# Patient Record
Sex: Male | Born: 2000
Health system: Southern US, Community
[De-identification: ages and names within clinical notes are randomized; demographics above are authoritative.]

## PROBLEM LIST (undated history)

## (undated) DIAGNOSIS — L0100 Impetigo, unspecified: Secondary | ICD-10-CM

## (undated) DIAGNOSIS — F32A Depression, unspecified: Secondary | ICD-10-CM

## (undated) DIAGNOSIS — F419 Anxiety disorder, unspecified: Secondary | ICD-10-CM

## (undated) DIAGNOSIS — Z8489 Family history of other specified conditions: Secondary | ICD-10-CM

## (undated) DIAGNOSIS — B958 Unspecified staphylococcus as the cause of diseases classified elsewhere: Secondary | ICD-10-CM

## (undated) DIAGNOSIS — H900 Conductive hearing loss, bilateral: Secondary | ICD-10-CM

## (undated) DIAGNOSIS — T7840XA Allergy, unspecified, initial encounter: Secondary | ICD-10-CM

## (undated) DIAGNOSIS — J45909 Unspecified asthma, uncomplicated: Secondary | ICD-10-CM

## (undated) DIAGNOSIS — D169 Benign neoplasm of bone and articular cartilage, unspecified: Secondary | ICD-10-CM

## (undated) DIAGNOSIS — L089 Local infection of the skin and subcutaneous tissue, unspecified: Secondary | ICD-10-CM

## (undated) DIAGNOSIS — H539 Unspecified visual disturbance: Secondary | ICD-10-CM

## (undated) DIAGNOSIS — L0390 Cellulitis, unspecified: Secondary | ICD-10-CM

## (undated) HISTORY — PX: EAR TUBE REMOVAL: SHX1486

## (undated) HISTORY — DX: Depression, unspecified: F32.A

## (undated) HISTORY — PX: OSTEOCHONDROMA EXCISION: SHX2137

## (undated) HISTORY — PX: TYMPANOSTOMY TUBE PLACEMENT: SHX32

---
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
## 2020-01-17 MED FILL — DESCOVY 200-25 MG TABS: 200-25 | 30 days supply | Qty: 30 | Fill #1

## 2020-01-27 ENCOUNTER — Ambulatory Visit (INDEPENDENT_AMBULATORY_CARE_PROVIDER_SITE_OTHER): Payer: 59 | Admitting: Psychology

## 2020-01-27 DIAGNOSIS — F4323 Adjustment disorder with mixed anxiety and depressed mood: Secondary | ICD-10-CM

## 2020-02-13 MED FILL — DESCOVY 200-25 MG TABS: 200-25 | 30 days supply | Qty: 30 | Fill #2

## 2020-02-23 ENCOUNTER — Ambulatory Visit (INDEPENDENT_AMBULATORY_CARE_PROVIDER_SITE_OTHER): Payer: 59 | Admitting: Psychology

## 2020-02-23 DIAGNOSIS — F4323 Adjustment disorder with mixed anxiety and depressed mood: Secondary | ICD-10-CM | POA: Diagnosis not present

## 2020-03-07 ENCOUNTER — Other Ambulatory Visit: Payer: Self-pay | Admitting: Pharmacist

## 2020-03-07 DIAGNOSIS — Z79899 Other long term (current) drug therapy: Secondary | ICD-10-CM

## 2020-03-07 MED ORDER — DESCOVY 200-25 MG PO TABS: 1.0000 | ORAL_TABLET | Freq: Every day | ORAL | 2 refills | Status: DC

## 2020-03-14 MED FILL — DESCOVY 200-25 MG TABS: 200-25 | 30 days supply | Qty: 30 | Fill #0

## 2020-03-22 ENCOUNTER — Ambulatory Visit: Payer: 59 | Admitting: Psychology

## 2020-03-30 ENCOUNTER — Other Ambulatory Visit: Payer: Self-pay | Admitting: Physician Assistant

## 2020-03-30 ENCOUNTER — Encounter: Payer: Self-pay | Admitting: Physician Assistant

## 2020-03-30 DIAGNOSIS — Z79899 Other long term (current) drug therapy: Secondary | ICD-10-CM

## 2020-03-30 DIAGNOSIS — Z111 Encounter for screening for respiratory tuberculosis: Secondary | ICD-10-CM | POA: Diagnosis not present

## 2020-03-30 DIAGNOSIS — Z23 Encounter for immunization: Secondary | ICD-10-CM | POA: Diagnosis not present

## 2020-03-30 NOTE — Progress Notes (Signed)
TB ordered

## 2020-04-03 LAB — MEASLES/MUMPS/RUBELLA IMMUNITY
MUMPS ABS, IGG: <9 AU/mL — ABNORMAL LOW (ref >10.9)
RUBEOLA AB, IGG: 43.1 AU/mL (ref >16.4)
Rubella Antibodies, IGG: 1.09 index (ref >0.99)

## 2020-04-03 LAB — QUANTIFERON-TB GOLD PLUS
QuantiFERON Mitogen Value: >10 IU/mL
QuantiFERON Nil Value: 0.03 IU/mL
QuantiFERON TB1 Ag Value: 0.08 IU/mL
QuantiFERON TB2 Ag Value: 0.12 IU/mL
QuantiFERON-TB Gold Plus: NEGATIVE

## 2020-04-03 LAB — HEP, RPR, HIV PANEL
HIV Screen 4th Generation wRfx: NONREACTIVE
Hepatitis B Surface Ag: NEGATIVE
RPR Ser Ql: NONREACTIVE

## 2020-04-05 ENCOUNTER — Telehealth: Payer: Self-pay

## 2020-04-05 NOTE — Telephone Encounter (Signed)
Seen by patient Joshua Yang on 04/05/2020 11:15 AM

## 2020-04-05 NOTE — Telephone Encounter (Signed)
-----  Message from Mar Daring, [place] sent at 04/05/2020 11:15 AM EDT ----- STD screen is negative. MMR titer shows you are immune to measles and rubella but not to mumps. Not sure if college will require repeat MMR. If so we can arrange. TB screen is negative.

## 2020-04-16 MED FILL — DESCOVY 200-25 MG TABS: 200-25 | 30 days supply | Qty: 30 | Fill #1

## 2020-04-17 ENCOUNTER — Telehealth: Payer: Self-pay

## 2020-04-17 DIAGNOSIS — Z23 Encounter for immunization: Secondary | ICD-10-CM

## 2020-04-17 MED ORDER — MEASLES, MUMPS & RUBELLA VAC IJ SOLR: 0.5000 mL | Freq: Once | INTRAMUSCULAR | 0 refills | Status: AC

## 2020-04-17 NOTE — Telephone Encounter (Signed)
Joshua Yang can you send a prescription for an MMR for [facility] please to  [facility] [place]

## 2020-04-17 NOTE — Telephone Encounter (Signed)
Sent in

## 2020-04-19 ENCOUNTER — Ambulatory Visit (INDEPENDENT_AMBULATORY_CARE_PROVIDER_SITE_OTHER): Payer: 59 | Admitting: Psychology

## 2020-04-19 DIAGNOSIS — F323 Major depressive disorder, single episode, severe with psychotic features: Secondary | ICD-10-CM

## 2020-05-14 MED FILL — DESCOVY 200-25 MG TABS: 200-25 | 30 days supply | Qty: 30 | Fill #2

## 2020-05-25 ENCOUNTER — Encounter: Payer: Self-pay | Admitting: Physician Assistant

## 2020-05-25 ENCOUNTER — Telehealth (INDEPENDENT_AMBULATORY_CARE_PROVIDER_SITE_OTHER): Payer: 59 | Admitting: Physician Assistant

## 2020-05-25 DIAGNOSIS — F902 Attention-deficit hyperactivity disorder, combined type: Secondary | ICD-10-CM

## 2020-05-25 MED ORDER — METHYLPHENIDATE HCL ER (OSM) 27 MG PO TBCR: 27.0000 mg | EXTENDED_RELEASE_TABLET | ORAL | 0 refills | Status: DC

## 2020-05-25 NOTE — Patient Instructions (Signed)
Methylphenidate extended-release tablets What is this medicine? METHYLPHENIDATE (meth il FEN i date) is used to treat attention-deficit hyperactivity disorder (ADHD). It is also used to treat narcolepsy. This medicine may be used for other purposes; ask your health care provider or pharmacist if you have questions. COMMON BRAND NAME(S): Concerta, Metadate ER, Methylin, RELEXXII, Ritalin SR What should I tell my health care provider before I take this medicine? They need to know if you have any of these conditions:  anxiety or panic attacks  circulation problems in fingers and toes  difficulty swallowing, problems with the esophagus, or a history of blockage of the stomach or intestines  glaucoma  hardening or blockages of the arteries or heart blood vessels  heart disease or a heart defect  high blood pressure  history of a drug or alcohol abuse problem  history of stroke  liver disease  mental illness  motor tics, family history or diagnosis of Tourette's syndrome  seizures  suicidal thoughts, plans, or attempt; a previous suicide attempt by you or a family member  thyroid disease  an unusual or allergic reaction to methylphenidate, other medicines, foods, dyes, or preservatives  pregnant or trying to get pregnant  breast-feeding How should I use this medicine? Take this medicine by mouth with a glass of water. Follow the directions on the prescription label. Do not crush, cut, or chew the tablet. You may take this medicine with food. Take your medicine at regular intervals. Do not take it more often than directed. If you take your medicine more than once a day, try to take your last dose at least 8 hours before bedtime. This well help prevent the medicine from interfering with your sleep. A special MedGuide will be given to you by the pharmacist with each prescription and refill. Be sure to read this information carefully each time. Talk to your pediatrician regarding  the use of this medicine in children. While this drug may be prescribed for children as young as 6 years for selected conditions, precautions do apply. Overdosage: If you think you have taken too much of this medicine contact a poison control center or emergency room at once. NOTE: This medicine is only for you. Do not share this medicine with others. What if I miss a dose? If you miss a dose, take it as soon as you can. If it is almost time for your next dose, take only that dose. Do not take double or extra doses. What may interact with this medicine? Do not take this medicine with any of the following medications:  lithium  MAOIs like Carbex, Eldepryl, Marplan, Nardil, and Parnate  other stimulant medicines for attention disorders, weight loss, or to stay awake  procarbazine This medicine may also interact with the following medications:  atomoxetine  caffeine  certain medicines for blood pressure, heart disease, irregular heart beat  certain medicines for depression, anxiety, or psychotic disturbances  certain medicines for seizures like carbamazepine, phenobarbital, phenytoin  cold or allergy medicines  warfarin This list may not describe all possible interactions. Give your health care provider a list of all the medicines, herbs, non-prescription drugs, or dietary supplements you use. Also tell them if you smoke, drink alcohol, or use illegal drugs. Some items may interact with your medicine. What should I watch for while using this medicine? Visit your doctor or health care professional for regular checks on your progress. This prescription requires that you follow special procedures with your doctor and pharmacy. You will need to  have a new written prescription from your doctor or health care professional every time you need a refill. This medicine may affect your concentration, or hide signs of tiredness. Until you know how this drug affects you, do not drive, ride a  bicycle, use machinery, or do anything that needs mental alertness. Tell your doctor or health care professional if this medicine loses its effects, or if you feel you need to take more than the prescribed amount. Do not change the dosage without talking to your doctor or health care professional. For males, contact your doctor or health care professional right away if you have an erection that lasts longer than 4 hours or if it becomes painful. This may be a sign of a serious problem and must be treated right away to prevent permanent damage. Decreased appetite is a common side effect when starting this medicine. Eating small, frequent meals or snacks can help. Talk to your doctor if you continue to have poor eating habits. Height and weight growth of a child taking this medicine will be monitored closely. Do not take this medicine close to bedtime. It may prevent you from sleeping. The tablet shell for some brands of this medicine does not dissolve. This is normal. The tablet shell may appear whole in the stool. This is not a cause for concern. If you are going to need surgery, a MRI, CT scan, or other procedure, tell your doctor that you are taking this medicine. You may need to stop taking this medicine before the procedure. Tell your doctor or healthcare professional right away if you notice unexplained wounds on your fingers and toes while taking this medicine. You should also tell your healthcare provider if you experience numbness or pain, changes in the skin color, or sensitivity to temperature in your fingers or toes. What side effects may I notice from receiving this medicine? Side effects that you should report to your doctor or health care professional as soon as possible:  allergic reactions like skin rash, itching or hives, swelling of the face, lips, or tongue  changes in vision  chest pain or chest tightness  fast, irregular heartbeat  fingers or toes feel numb, cool,  painful  hallucination, loss of contact with reality  high blood pressure  males: prolonged or painful erection  seizures  severe headaches  severe stomach pain, vomiting  shortness of breath  suicidal thoughts or other mood changes  trouble swallowing  trouble walking, dizziness, loss of balance or coordination  uncontrollable head, mouth, neck, arm, or leg movements  unusual bleeding or bruising Side effects that usually do not require medical attention (report to your doctor or health care professional if they continue or are bothersome):  anxious  headache  loss of appetite  nausea  trouble sleeping  weight loss This list may not describe all possible side effects. Call your doctor for medical advice about side effects. You may report side effects to FDA at 1-800-FDA-1088. Where should I keep my medicine? Keep out of the reach of children. This medicine can be abused. Keep your medicine in a safe place to protect it from theft. Do not share this medicine with anyone. Selling or giving away this medicine is dangerous and against the law. This medicine may cause accidental overdose and death if taken by other adults, children, or pets. Mix any unused medicine with a substance like cat litter or coffee grounds. Then throw the medicine away in a sealed container like a sealed bag or  a coffee can with a lid. Do not use the medicine after the expiration date. Store at room temperature between 15 and 30 degrees C (59 and 86 degrees F). Protect from light and moisture. Keep container tightly closed. NOTE: This sheet is a summary. It may not cover all possible information. If you have questions about this medicine, talk to your doctor, pharmacist, or health care provider.  2020 Elsevier/Gold Standard (2016-12-09 15:01:37)

## 2020-05-25 NOTE — Progress Notes (Signed)
MyChart Video Visit    Virtual Visit via Video Note   This visit type was conducted due to national recommendations for restrictions regarding the COVID-19 Pandemic (e.g. social distancing) in an effort to limit this patient's exposure and mitigate transmission in our community. This patient is at least at moderate risk for complications without adequate follow up. This format is felt to be most appropriate for this patient at this time. Physical exam was limited by quality of the video and audio technology used for the visit.   Patient location: [facility] Provider location: [facility]  I discussed the limitations of evaluation and management by telemedicine and the availability of in person appointments. The patient expressed understanding and agreed to proceed.  Patient: Joshua Yang   DOB: 01/22/2001   18 y.o. Male  MRN: [id number] Visit Date: 05/25/2020  Today's healthcare provider: Mar Daring, PA-C   No chief complaint on file.  Subjective    HPI   Joshua Yang is an 19 yr old male that presents via mychart video visit for worsening ADHD. He was diagnosed when he was a kid at his pediatrician's office, but he was never medicated. He reports since he started college he has been having worsening symptoms and having issues with keeping grades up. He is interested in starting treatment. There is family history of ADHD in his mother.   Patient Active Problem List   Diagnosis Date Noted  . On pre-exposure prophylaxis for HIV 12/12/2019  . Vitamin D deficiency 09/07/2019  . Asthma   . 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 Osteochondroma 07/11/2015   Past Medical History:  Diagnosis Date  . Allergy    seasonal  . Anxiety     no medications  . Asthma    allergy induced asthma  . Cellulitis    hx of  . Family history of adverse reaction to anesthesia    mother had historu of running a high fever after surgery, stated it is not malignant hyperthermia   . Hearing loss, conductive, bilateral   . Impetigo    hx of  . Multiple hereditary osteochondromas   . Staph skin infection   . Vision abnormalities    wears glasses      Medications: Outpatient Medications Prior to Visit  Medication Sig  . albuterol (VENTOLIN HFA) 108 (90 Base) MCG/ACT inhaler Inhale 2 puffs into the lungs every 6 (six) hours as needed for wheezing or shortness of breath. For shortness of breath  . Beclomethasone Dipropionate 80 MCG/ACT AERS Place 2 sprays into the nose daily.  [name] emtricitabine-tenofovir AF (DESCOVY) 200-25 MG tablet Take 1 tablet by mouth daily.  [name] gentamicin cream (GARAMYCIN) 0.1 % Apply 1 application topically 2 (two) times daily.  [name] levocetirizine (XYZAL) 5 MG tablet Take 1 tablet (5 mg total) by mouth every evening.  . sertraline (ZOLOFT) 100 MG tablet Take 1 tablet (100 mg total) by mouth daily.  [name] triamcinolone cream (KENALOG) 0.1 % Apply 1 application topically 2 (two) times daily.  . Vitamin D, Cholecalciferol, 25 MCG (1000 UT) CAPS Takes once daily   No facility-administered medications prior to visit.    Review of Systems  Constitutional: Negative.   Respiratory: Negative.   Cardiovascular: Negative.   Psychiatric/Behavioral: Positive for decreased concentration.    Last CBC Lab Results  Component Value Date   WBC 7.2 09/06/2019   HGB 15.6 09/06/2019   HCT 48.0 09/06/2019   MCV 81 09/06/2019   MCH 26.2 (L) 09/06/2019  RDW 13.2 09/06/2019   PLT 278 65/10/5463   Last metabolic panel Lab Results  Component Value Date   GLUCOSE 96 09/06/2019   NA 139 09/06/2019   K 4.3 09/06/2019   CL 103 09/06/2019   CO2 21 09/06/2019   BUN 11 09/06/2019   CREATININE 0.83 09/06/2019   GFRNONAA 128 09/06/2019   GFRAA 149 09/06/2019   CALCIUM 9.6 09/06/2019   PROT 7.1 09/06/2019   ALBUMIN 4.6 09/06/2019   LABGLOB 2.5 09/06/2019   AGRATIO 1.8 09/06/2019   BILITOT 0.2 09/06/2019   ALKPHOS 69 09/06/2019   AST 20 09/06/2019   ALT 27  09/06/2019      Objective    There were no vitals taken for this visit. BP Readings from Last 3 Encounters:  12/12/19 126/85  09/29/19 (!) 143/88  09/01/19 (!) 153/81   Wt Readings from Last 3 Encounters:  12/12/19 244 lb (110.7 kg) (>99 %, Z= 2.35)*  09/29/19 245 lb (111.1 kg) (>99 %, Z= 2.38)*  09/01/19 244 lb (110.7 kg) (>99 %, Z= 2.37)*   * Growth percentiles are based on CDC (Boys, 2-20 Years) data.      Physical Exam Vitals reviewed.  Constitutional:      General: He is not in acute distress.    Appearance: Normal appearance. He is well-developed. He is not ill-appearing.  HENT:     Head: Normocephalic and atraumatic.  Eyes:     Conjunctiva/sclera: Conjunctivae normal.  Pulmonary:     Effort: Pulmonary effort is normal. No respiratory distress.  Musculoskeletal:     Cervical back: Normal range of motion and neck supple.  Neurological:     Mental Status: He is alert.  Psychiatric:        Mood and Affect: Mood normal.        Behavior: Behavior normal.        Thought Content: Thought content normal.        Judgment: Judgment normal.        Assessment & Plan     1. Attention deficit hyperactivity disorder (ADHD), combined type Will start Concerta as below. F/U in 4-6 weeks to see how tolerating medication and if adjustments needed.  - methylphenidate (CONCERTA) 27 MG PO CR tablet; Take 1 tablet (27 mg total) by mouth every morning.  Dispense: 30 tablet; Refill: 0   No follow-ups on file.     I discussed the assessment and treatment plan with the patient. The patient was provided an opportunity to ask questions and all were answered. The patient agreed with the plan and demonstrated an understanding of the instructions.   The patient was advised to call back or seek an in-person evaluation if the symptoms worsen or if the condition fails to improve as anticipated.  I provided 15 minutes of face-to-face time during this encounter via MyChart Video enabled  encounter.  Reynolds Bowl, PA-C, have reviewed all documentation for this visit. The documentation on 05/29/20 for the exam, diagnosis, procedures, and orders are all accurate and complete.  [place] [facility] [phone] (phone) [phone] (fax)  [place]

## 2020-05-31 ENCOUNTER — Ambulatory Visit (INDEPENDENT_AMBULATORY_CARE_PROVIDER_SITE_OTHER): Payer: 59 | Admitting: Psychology

## 2020-05-31 DIAGNOSIS — F4323 Adjustment disorder with mixed anxiety and depressed mood: Secondary | ICD-10-CM | POA: Diagnosis not present

## 2020-06-05 ENCOUNTER — Other Ambulatory Visit: Payer: Self-pay | Admitting: Pharmacist

## 2020-06-05 ENCOUNTER — Other Ambulatory Visit (HOSPITAL_COMMUNITY): Payer: Self-pay | Admitting: Pharmacist

## 2020-06-05 DIAGNOSIS — Z79899 Other long term (current) drug therapy: Secondary | ICD-10-CM

## 2020-06-05 MED ORDER — DESCOVY 200-25 MG PO TABS: 1.0000 | ORAL_TABLET | Freq: Every day | ORAL | 3 refills | Status: DC

## 2020-06-05 NOTE — Progress Notes (Signed)
Patient's PA sent in [place] refills for a year. HIV PrEP is only supposed to be prescribed for 3 months at a time (pending negative HIV test). Will resend Rx but it is recommended to at least test for HIV every 3 months.

## 2020-06-07 MED FILL — DESCOVY 200-25 MG TABS: 200-25 | 30 days supply | Qty: 30 | Fill #0

## 2020-06-20 ENCOUNTER — Telehealth (INDEPENDENT_AMBULATORY_CARE_PROVIDER_SITE_OTHER): Payer: 59 | Admitting: Physician Assistant

## 2020-06-20 ENCOUNTER — Other Ambulatory Visit: Payer: Self-pay | Admitting: Physician Assistant

## 2020-06-20 ENCOUNTER — Other Ambulatory Visit: Payer: Self-pay

## 2020-06-20 ENCOUNTER — Encounter: Payer: Self-pay | Admitting: Physician Assistant

## 2020-06-20 DIAGNOSIS — F900 Attention-deficit hyperactivity disorder, predominantly inattentive type: Secondary | ICD-10-CM

## 2020-06-20 MED ORDER — METHYLPHENIDATE HCL ER (OSM) 54 MG PO TBCR: 54.0000 mg | EXTENDED_RELEASE_TABLET | ORAL | 0 refills | Status: DC

## 2020-06-20 NOTE — Progress Notes (Signed)
MyChart Video Visit    Virtual Visit via Video Note   This visit type was conducted due to national recommendations for restrictions regarding the COVID-19 Pandemic (e.g. social distancing) in an effort to limit this patient's exposure and mitigate transmission in our community. This patient is at least at moderate risk for complications without adequate follow up. This format is felt to be most appropriate for this patient at this time. Physical exam was limited by quality of the video and audio technology used for the visit.   Patient location: [facility]  Provider location: [facility]  I discussed the limitations of evaluation and management by telemedicine and the availability of in person appointments. The patient expressed understanding and agreed to proceed.  Patient: Joshua Yang   DOB: [birth year]   19 y.o. Male  MRN: [id number] Visit Date: 06/20/2020  Today's healthcare provider: Mar Daring, PA-C   No chief complaint on file.  Subjective    HPI  Follow up for ADHD  The patient was last seen for this 4-6 weeks ago. Changes made at last visit include Will start Concerta..  He reports excellent compliance with treatment. He feels that condition is Unchanged. He is not having side effects.   -----------------------------------------------------------------------------------------   Patient Active Problem List   Diagnosis Date Noted  . On pre-exposure prophylaxis for HIV 12/12/2019  . Vitamin D deficiency 09/07/2019  . Asthma   . 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 Osteochondroma 07/11/2015   Past Medical History:  Diagnosis Date  . Allergy    seasonal  . Anxiety     no medications  . Asthma    allergy induced asthma  . Cellulitis    hx of  . Family history of adverse reaction to anesthesia    mother had historu of running a high fever after surgery, stated it is not malignant hyperthermia  . Hearing loss, conductive, bilateral    . Impetigo    hx of  . Multiple hereditary osteochondromas   . Staph skin infection   . Vision abnormalities    wears glasses     Patient Active Problem List   Diagnosis Date Noted  . On pre-exposure prophylaxis for HIV 12/12/2019  . Vitamin D deficiency 09/07/2019  . Asthma   . 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 Osteochondroma 07/11/2015   Past Medical History:  Diagnosis Date  . Allergy    seasonal  . Anxiety     no medications  . Asthma    allergy induced asthma  . Cellulitis    hx of  . Family history of adverse reaction to anesthesia    mother had historu of running a high fever after surgery, stated it is not malignant hyperthermia  . Hearing loss, conductive, bilateral   . Impetigo    hx of  . Multiple hereditary osteochondromas   . Staph skin infection   . Vision abnormalities    wears glasses      Medications: Outpatient Medications Prior to Visit  Medication Sig  . albuterol (VENTOLIN HFA) 108 (90 Base) MCG/ACT inhaler Inhale 2 puffs into the lungs every 6 (six) hours as needed for wheezing or shortness of breath. For shortness of breath  . Beclomethasone Dipropionate 80 MCG/ACT AERS Place 2 sprays into the nose daily.  [name] emtricitabine-tenofovir AF (DESCOVY) 200-25 MG tablet Take 1 tablet by mouth daily.  [name] levocetirizine (XYZAL) 5 MG tablet Take 1 tablet (5 mg total) by mouth every evening.  . methylphenidate (  CONCERTA) 27 MG PO CR tablet Take 1 tablet (27 mg total) by mouth every morning.  . sertraline (ZOLOFT) 100 MG tablet Take 1 tablet (100 mg total) by mouth daily.  [name] triamcinolone cream (KENALOG) 0.1 % Apply 1 application topically 2 (two) times daily.  . Vitamin D, Cholecalciferol, 25 MCG (1000 UT) CAPS Takes once daily  . gentamicin cream (GARAMYCIN) 0.1 % Apply 1 application topically 2 (two) times daily. (Patient not taking: Reported on 06/20/2020)   No facility-administered medications prior to visit.    Review of Systems  Last  CBC Lab Results  Component Value Date   WBC 7.2 09/06/2019   HGB 15.6 09/06/2019   HCT 48.0 09/06/2019   MCV 81 09/06/2019   MCH 26.2 (L) 09/06/2019   RDW 13.2 09/06/2019   PLT 278 70/26/3785   Last metabolic panel Lab Results  Component Value Date   GLUCOSE 96 09/06/2019   NA 139 09/06/2019   K 4.3 09/06/2019   CL 103 09/06/2019   CO2 21 09/06/2019   BUN 11 09/06/2019   CREATININE 0.83 09/06/2019   GFRNONAA 128 09/06/2019   GFRAA 149 09/06/2019   CALCIUM 9.6 09/06/2019   PROT 7.1 09/06/2019   ALBUMIN 4.6 09/06/2019   LABGLOB 2.5 09/06/2019   AGRATIO 1.8 09/06/2019   BILITOT 0.2 09/06/2019   ALKPHOS 69 09/06/2019   AST 20 09/06/2019   ALT 27 09/06/2019      Objective    There were no vitals taken for this visit. BP Readings from Last 3 Encounters:  12/12/19 126/85  09/29/19 (!) 143/88  09/01/19 (!) 153/81   Wt Readings from Last 3 Encounters:  12/12/19 244 lb (110.7 kg) (>99 %, Z= 2.35)*  09/29/19 245 lb (111.1 kg) (>99 %, Z= 2.38)*  09/01/19 244 lb (110.7 kg) (>99 %, Z= 2.37)*   * Growth percentiles are based on CDC (Boys, 2-20 Years) data.      Physical Exam     Assessment & Plan     1. Attention deficit hyperactivity disorder (ADHD), predominantly inattentive type Not to goal. Still having symptoms without any improvement. Increase Concerta to 54mg  as below. F/U in 4 weeks.  - methylphenidate (CONCERTA) 54 MG PO CR tablet; Take 1 tablet (54 mg total) by mouth every morning.  Dispense: 30 tablet; Refill: 0   No follow-ups on file.     I discussed the assessment and treatment plan with the patient. The patient was provided an opportunity to ask questions and all were answered. The patient agreed with the plan and demonstrated an understanding of the instructions.   The patient was advised to call back or seek an in-person evaluation if the symptoms worsen or if the condition fails to improve as anticipated.  I provided 11 minutes of face-to-face  time during this encounter via MyChart Video enabled encounter.  Reynolds Bowl, PA-C, have reviewed all documentation for this visit. The documentation on 06/20/20 for the exam, diagnosis, procedures, and orders are all accurate and complete.  [place] [facility] [phone] (phone) [phone] (fax)  [place]

## 2020-06-21 ENCOUNTER — Ambulatory Visit: Payer: 59

## 2020-06-28 ENCOUNTER — Ambulatory Visit (INDEPENDENT_AMBULATORY_CARE_PROVIDER_SITE_OTHER): Payer: 59 | Admitting: Psychology

## 2020-06-28 DIAGNOSIS — F4323 Adjustment disorder with mixed anxiety and depressed mood: Secondary | ICD-10-CM | POA: Diagnosis not present

## 2020-07-02 ENCOUNTER — Ambulatory Visit (INDEPENDENT_AMBULATORY_CARE_PROVIDER_SITE_OTHER): Payer: 59 | Admitting: Physician Assistant

## 2020-07-02 ENCOUNTER — Other Ambulatory Visit: Payer: Self-pay

## 2020-07-02 DIAGNOSIS — Z23 Encounter for immunization: Secondary | ICD-10-CM

## 2020-07-02 DIAGNOSIS — Z20822 Contact with and (suspected) exposure to covid-19: Secondary | ICD-10-CM | POA: Diagnosis not present

## 2020-07-02 NOTE — Progress Notes (Signed)
Nurse Visit: patient here for covid booster vaccine.Patient tolerated well.

## 2020-07-04 ENCOUNTER — Ambulatory Visit: Payer: 59 | Admitting: Physician Assistant

## 2020-07-09 MED FILL — DESCOVY 200-25 MG TABS: 200-25 | 30 days supply | Qty: 30 | Fill #1

## 2020-07-17 ENCOUNTER — Encounter: Payer: Self-pay | Admitting: Family Medicine

## 2020-07-17 ENCOUNTER — Other Ambulatory Visit: Payer: Self-pay

## 2020-07-17 ENCOUNTER — Telehealth (INDEPENDENT_AMBULATORY_CARE_PROVIDER_SITE_OTHER): Payer: 59 | Admitting: Family Medicine

## 2020-07-17 DIAGNOSIS — R42 Dizziness and giddiness: Secondary | ICD-10-CM

## 2020-07-17 DIAGNOSIS — R197 Diarrhea, unspecified: Secondary | ICD-10-CM

## 2020-07-17 MED ORDER — MECLIZINE HCL 12.5 MG PO TABS: 12.5000 mg | ORAL_TABLET | Freq: Three times a day (TID) | ORAL | 0 refills | Status: AC | PRN

## 2020-07-17 NOTE — Progress Notes (Signed)
MyChart Video Visit    Virtual Visit via Video Note   This visit type was conducted due to national recommendations for restrictions regarding the COVID-19 Pandemic (e.g. social distancing) in an effort to limit this patient's exposure and mitigate transmission in our community. This patient is at least at moderate risk for complications without adequate follow up. This format is felt to be most appropriate for this patient at this time. Physical exam was limited by quality of the video and audio technology used for the visit.   Patient location: HOME Provider location: OFFICE  I discussed the limitations of evaluation and management by telemedicine and the availability of in person appointments. The patient expressed understanding and agreed to proceed.  Patient: Joshua Yang   DOB: 23-May-2001   19 y.o. Male  MRN: [id number] Visit Date: 07/17/2020  Today's healthcare provider: Vernie Murders, PA   No chief complaint on file.  Subjective    HPI  Patient is a 19 year old male who presents via video visit for evaluation of dizziness.  He states it began yesterday when he was lying on his bed and went to get up to use the bathroom he first noticed being dizzy. While in the bathroom he states he developed nausea but was unable to vomit.  He thought sleep might help so, he went back to bed but when he woke again he was still having the dizziness.  He states it is not like the room spinning but described it as feeling like he is in bed and going to fall out of bed.  He took Advil last night and got a little relief but it did not last.   He states that he has not been taking his medications the way he should.  He had been off of his Concerta for 2 weeks and just got back on it this morning for the first time.  He also said he has not taken any of his Sertraline since las week.   He denies any cold like symptoms.  He has had a mild headache and some visual disturbance  Past Medical  History:  Diagnosis Date  . Allergy    seasonal  . Anxiety     no medications  . Asthma    allergy induced asthma  . Cellulitis    hx of  . Family history of adverse reaction to anesthesia    mother had historu of running a high fever after surgery, stated it is not malignant hyperthermia  . Hearing loss, conductive, bilateral   . Impetigo    hx of  . Multiple hereditary osteochondromas   . Staph skin infection   . Vision abnormalities    wears glasses   Past Surgical History:  Procedure Laterality Date  . EAR TUBE REMOVAL    . OSTEOCHONDROMA EXCISION     x2 one on heel and one on upper thigh  . OSTEOCHONDROMA EXCISION  03/10/2012   Procedure: OSTEOCHONDROMA EXCISION;  Surgeon: Newt Minion, MD;  Location: [facility];  Service: Orthopedics;  Laterality: Right;  Excision distal right tibia and fibula osteochondroma  . OSTEOCHONDROMA EXCISION Right 03/30/2013   Procedure: EXCISION OSTEOCHONDROMA RIGHT SHOULDER AND RIGHT WRIST;  Surgeon: Newt Minion, MD;  Location: [facility];  Service: Orthopedics;  Laterality: Right;  . OSTEOCHONDROMA EXCISION Bilateral 07/11/2015   Procedure: OSTEOCHONDROMA EXCISION BILATERAL MEDIAL TIBIAL PLATEAU;  Surgeon: Newt Minion, MD;  Location: [facility];  Service: Orthopedics;  Laterality: Bilateral;  .  OSTEOCHONDROMA EXCISION Left 04/01/2017   Procedure: OSTEOCHONDROMA EXCISION x2 LEFT WRIST;  Surgeon: Newt Minion, MD;  Location: [facility];  Service: Orthopedics;  Laterality: Left;  . TYMPANOSTOMY TUBE PLACEMENT     Social History   Tobacco Use  . Smoking status: Never Smoker  . Smokeless tobacco: Never Used  . Tobacco comment: No smokers in home  Substance Use Topics  . Alcohol use: No  . Drug use: No   Family History  Problem Relation Age of Onset  . Diabetes Other   . Hyperlipidemia Father   . Diabetes Maternal Grandfather   . Hyperlipidemia Maternal Grandfather   . Hypertension Maternal Grandfather   . Arthritis Maternal Grandfather   . Alcohol  abuse Paternal Grandmother   . Arthritis Paternal Grandmother   . Arthritis Mother   . Scoliosis Mother   . Asthma Mother   . Depression Mother   . Arthritis Maternal Grandmother   . Arthritis Paternal Grandfather   . Diabetes Other   . Stroke Sister   . Kidney disease Other   . Diabetes Other    Allergies  Allergen Reactions  . Latex Other (See Comments)    Skin irritation  . Amoxicillin-Pot Clavulanate Diarrhea and Nausea And Vomiting    Severe   . Tape Rash    Plastic Tape      Medications: Outpatient Medications Prior to Visit  Medication Sig  . methylphenidate (CONCERTA) 54 MG PO CR tablet Take 1 tablet (54 mg total) by mouth every morning.  [name] albuterol (VENTOLIN HFA) 108 (90 Base) MCG/ACT inhaler Inhale 2 puffs into the lungs every 6 (six) hours as needed for wheezing or shortness of breath. For shortness of breath (Patient not taking: Reported on 07/17/2020)  . Beclomethasone Dipropionate 80 MCG/ACT AERS Place 2 sprays into the nose daily. (Patient not taking: Reported on 07/17/2020)  . emtricitabine-tenofovir AF (DESCOVY) 200-25 MG tablet Take 1 tablet by mouth daily. (Patient not taking: Reported on 07/17/2020)  . gentamicin cream (GARAMYCIN) 0.1 % Apply 1 application topically 2 (two) times daily. (Patient not taking: Reported on 06/20/2020)  . levocetirizine (XYZAL) 5 MG tablet Take 1 tablet (5 mg total) by mouth every evening. (Patient not taking: Reported on 07/17/2020)  . sertraline (ZOLOFT) 100 MG tablet Take 1 tablet (100 mg total) by mouth daily. (Patient not taking: Reported on 07/17/2020)  . triamcinolone cream (KENALOG) 0.1 % Apply 1 application topically 2 (two) times daily. (Patient not taking: Reported on 07/17/2020)  . Vitamin D, Cholecalciferol, 25 MCG (1000 UT) CAPS Takes once daily (Patient not taking: Reported on 07/17/2020)   No facility-administered medications prior to visit.    Review of Systems  HENT: Negative for congestion, ear pain,  postnasal drip, rhinorrhea, sinus pressure, sinus pain, sore throat and tinnitus.   Gastrointestinal: Positive for diarrhea (1 EPISODE TODAY) and nausea. Negative for abdominal pain.  Musculoskeletal: Positive for myalgias (left arm pain).  Neurological: Positive for dizziness and headaches.      Objective    There were no vitals taken for this visit.   Physical Exam: WDWN male in no apparent distress.  Head: Normocephalic, atraumatic. Neck: Supple, NROM Respiratory: No apparent distress Psych: Normal mood and affect     Assessment & Plan     1. Dizziness Onset the past couple days with slight nausea. No vomiting, congestion, cough or earache, but slight headache relieved by Ibuprofen. Increase fluid intake and go on a bland diet. Treat with Meclizine to help control dizziness  and nausea. Recheck prn. - meclizine (ANTIVERT) 12.5 MG tablet; Take 1 tablet (12.5 mg total) by mouth 3 (three) times daily as needed for dizziness.  Dispense: 30 tablet; Refill: 0  2. Diarrhea, unspecified type A little loose stool today but no abdominal cramps or blood in stools. No vomiting. May use Pepto-Bismol prn and increase fluid intake with bland diet.   No follow-ups on file.     I discussed the assessment and treatment plan with the patient. The patient was provided an opportunity to ask questions and all were answered. The patient agreed with the plan and demonstrated an understanding of the instructions.   The patient was advised to call back or seek an in-person evaluation if the symptoms worsen or if the condition fails to improve as anticipated.  I provided 20 minutes of non-face-to-face time during this encounter.  Andres Shad, PA, have reviewed all documentation for this visit. The documentation on 07/17/20 for the exam, diagnosis, procedures, and orders are all accurate and complete.   Vernie Murders, [place] [phone] (phone) [phone]  (fax)  [place]

## 2020-07-26 ENCOUNTER — Ambulatory Visit (INDEPENDENT_AMBULATORY_CARE_PROVIDER_SITE_OTHER): Payer: 59 | Admitting: Psychology

## 2020-07-26 DIAGNOSIS — F4323 Adjustment disorder with mixed anxiety and depressed mood: Secondary | ICD-10-CM

## 2020-08-09 ENCOUNTER — Other Ambulatory Visit: Payer: Self-pay | Admitting: Physician Assistant

## 2020-08-09 DIAGNOSIS — F900 Attention-deficit hyperactivity disorder, predominantly inattentive type: Secondary | ICD-10-CM

## 2020-08-09 MED ORDER — METHYLPHENIDATE HCL ER (OSM) 54 MG PO TBCR: 54.0000 mg | EXTENDED_RELEASE_TABLET | ORAL | 0 refills | Status: DC

## 2020-08-14 MED FILL — DESCOVY 200-25 MG TABS: 200-25 | 30 days supply | Qty: 30 | Fill #2

## 2020-08-22 ENCOUNTER — Ambulatory Visit (INDEPENDENT_AMBULATORY_CARE_PROVIDER_SITE_OTHER): Payer: 59 | Admitting: Psychology

## 2020-08-22 DIAGNOSIS — F4323 Adjustment disorder with mixed anxiety and depressed mood: Secondary | ICD-10-CM

## 2020-08-23 ENCOUNTER — Ambulatory Visit: Payer: 59 | Admitting: Psychology

## 2020-09-14 ENCOUNTER — Telehealth: Payer: 59 | Admitting: Physician Assistant

## 2020-09-14 DIAGNOSIS — M25531 Pain in right wrist: Secondary | ICD-10-CM | POA: Diagnosis not present

## 2020-09-14 NOTE — Progress Notes (Signed)
MyChart Video Visit    Virtual Visit via Video Note   This visit type was conducted due to national recommendations for restrictions regarding the COVID-19 Pandemic (e.g. social distancing) in an effort to limit this patient's exposure and mitigate transmission in our community. This patient is at least at moderate risk for complications without adequate follow up. This format is felt to be most appropriate for this patient at this time. Physical exam was limited by quality of the video and audio technology used for the visit.   Patient location: [facility] Provider location: [facility]  I discussed the limitations of evaluation and management by telemedicine and the availability of in person appointments. The patient expressed understanding and agreed to proceed.  Patient: Joshua Yang   DOB: 2001-03-18   20 y.o. Male  MRN: [id number] Visit Date: 09/14/2020  Today's healthcare provider: Mar Daring, PA-C   Chief Complaint  Patient presents with  . Wrist Pain   Subjective    Wrist Pain  The pain is present in the right wrist. This is a new problem. The current episode started in the past 7 days. The problem has been gradually worsening. Pertinent negatives include no fever, inability to bear weight, itching, joint locking, joint swelling, limited range of motion, numbness, stiffness or tingling. He has tried NSAIDS for the symptoms. The treatment provided mild relief.    Patient does have a history of a bone tumor (osteochondroma) in the left wrist when he was younger. He reports this feels very similar. He reports that is main complaint is the he is noticing he is having some limited pronation and supination of the forearm and feels like something may be blocking. Pain is mild.   Patient Active Problem List   Diagnosis Date Noted  . On pre-exposure prophylaxis for HIV 12/12/2019  . Vitamin D deficiency 09/07/2019  . Asthma   . Multiple  hereditary exostoses 03/16/2017  . Osteochondroma 07/11/2015   Past Medical History:  Diagnosis Date  . Allergy    seasonal  . Anxiety     no medications  . Asthma    allergy induced asthma  . Cellulitis    hx of  . Family history of adverse reaction to anesthesia    mother had historu of running a high fever after surgery, stated it is not malignant hyperthermia  . Hearing loss, conductive, bilateral   . Impetigo    hx of  . Multiple hereditary osteochondromas   . Staph skin infection   . Vision abnormalities    wears glasses   Social History   Tobacco Use  . Smoking status: Never Smoker  . Smokeless tobacco: Never Used  . Tobacco comment: No smokers in home  Substance Use Topics  . Alcohol use: No  . Drug use: No   Allergies  Allergen Reactions  . Latex Other (See Comments)    Skin irritation  . Amoxicillin-Pot Clavulanate Diarrhea and Nausea And Vomiting    Severe   . Tape Rash    Plastic Tape    Medications: Outpatient Medications Prior to Visit  Medication Sig  . albuterol (VENTOLIN HFA) 108 (90 Base) MCG/ACT inhaler Inhale 2 puffs into the lungs every 6 (six) hours as needed for wheezing or shortness of breath. For shortness of breath  . Beclomethasone Dipropionate 80 MCG/ACT AERS Place 2 sprays into the nose daily.  [name] levocetirizine (XYZAL) 5 MG tablet Take 1 tablet (5 mg total) by mouth every  evening.  . meclizine (ANTIVERT) 12.5 MG tablet Take 1 tablet (12.5 mg total) by mouth 3 (three) times daily as needed for dizziness.  . methylphenidate (CONCERTA) 54 MG PO CR tablet Take 1 tablet (54 mg total) by mouth every morning.  . sertraline (ZOLOFT) 100 MG tablet Take 1 tablet (100 mg total) by mouth daily.   No facility-administered medications prior to visit.    Review of Systems  Constitutional: Negative.  Negative for fever.  Respiratory: Negative.   Cardiovascular: Negative.   Musculoskeletal: Positive for arthralgias. Negative for back pain, gait  problem, joint swelling, myalgias, neck pain, neck stiffness and stiffness.  Skin: Negative for itching.  Neurological: Negative for tingling and numbness.      Objective    There were no vitals taken for this visit.   Physical Exam   Patient denies any numbness or tingling. ROM is currently maintained. Negative Tinel and Phalen sign when self performed. Wrist flexion, extension, pronation and supination do not increase pain. Noted with some tenderness with ulnar and radial deviation. Patient denies any palpable mass.   Assessment & Plan     1. Right wrist pain Patient with h/o osteochondroma of the left. Now with similar symptoms on the right. Will try a cock-up wrist splint with activity/writing. Will get xray as below. I will f/u pending results.  - DG Wrist Complete Right; Future   No follow-ups on file.     I discussed the assessment and treatment plan with the patient. The patient was provided an opportunity to ask questions and all were answered. The patient agreed with the plan and demonstrated an understanding of the instructions.   The patient was advised to call back or seek an in-person evaluation if the symptoms worsen or if the condition fails to improve as anticipated.  I provided 13 minutes of face-to-face time during this encounter via MyChart Video enabled encounter.  Reynolds Bowl, PA-C, have reviewed all documentation for this visit. The documentation on 09/16/20 for the exam, diagnosis, procedures, and orders are all accurate and complete.  Rubye Beach [facility] [phone] (phone) [phone] (fax)  [place]

## 2020-09-16 ENCOUNTER — Encounter: Payer: Self-pay | Admitting: Physician Assistant

## 2020-09-19 ENCOUNTER — Ambulatory Visit: Payer: 59 | Admitting: Psychology

## 2020-09-20 ENCOUNTER — Ambulatory Visit: Payer: 59 | Admitting: Psychology

## 2020-09-21 ENCOUNTER — Ambulatory Visit: Payer: 59 | Admitting: Physician Assistant

## 2020-09-21 ENCOUNTER — Other Ambulatory Visit: Payer: Self-pay

## 2020-09-21 ENCOUNTER — Ambulatory Visit
Admission: RE | Admit: 2020-09-21 | Discharge: 2020-09-21 | Disposition: A | Discharge status: Home / Self Care | Payer: 59 | Attending: Physician Assistant | Admitting: Physician Assistant

## 2020-09-21 ENCOUNTER — Encounter: Payer: Self-pay | Admitting: Physician Assistant

## 2020-09-21 ENCOUNTER — Ambulatory Visit
Admission: RE | Admit: 2020-09-21 | Discharge: 2020-09-21 | Disposition: A | Discharge status: Home / Self Care | Payer: 59 | Source: Ambulatory Visit | Attending: Physician Assistant | Admitting: Physician Assistant

## 2020-09-21 VITALS — BP 131/89 | HR 75 | Temp 98.4°F | Wt 230.0 lb

## 2020-09-21 DIAGNOSIS — M25531 Pain in right wrist: Secondary | ICD-10-CM

## 2020-09-21 DIAGNOSIS — Z8349 Family history of other endocrine, nutritional and metabolic diseases: Secondary | ICD-10-CM | POA: Diagnosis not present

## 2020-09-21 DIAGNOSIS — Z113 Encounter for screening for infections with a predominantly sexual mode of transmission: Secondary | ICD-10-CM | POA: Diagnosis not present

## 2020-09-21 DIAGNOSIS — H6123 Impacted cerumen, bilateral: Secondary | ICD-10-CM | POA: Diagnosis not present

## 2020-09-21 DIAGNOSIS — E559 Vitamin D deficiency, unspecified: Secondary | ICD-10-CM | POA: Diagnosis not present

## 2020-09-21 NOTE — Progress Notes (Signed)
Established patient visit   Patient: Joshua Yang   DOB: 2001/07/14   20 y.o. Male  MRN: [id number] Visit Date: 09/21/2020  Today's healthcare provider: Mar Daring, PA-C   Chief Complaint  Patient presents with   Ear Fullness   Subjective    Ear Fullness  There is pain in both ears. This is a new problem. The current episode started 1 to 4 weeks ago. The problem occurs constantly. The problem has been unchanged. There has been no fever. The pain is mild. Pertinent negatives include no abdominal pain, coughing, diarrhea, ear discharge, headaches, hearing loss, neck pain, rash, rhinorrhea, sore throat or vomiting. He has tried NSAIDs and acetaminophen for the symptoms. The treatment provided mild relief.   Thinks he may have ear wax build up.    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   Past Medical History:  Diagnosis Date   Allergy    seasonal   Anxiety     no medications   Asthma    allergy induced asthma   Cellulitis    hx of   Family history of adverse reaction to anesthesia    mother had historu of running a high fever after surgery, stated it is not malignant hyperthermia   Hearing loss, conductive, bilateral    Impetigo    hx of   Multiple hereditary osteochondromas    Staph skin infection    Vision abnormalities    wears glasses   Social History   Tobacco Use   Smoking status: Never Smoker   Smokeless tobacco: Never Used   Tobacco comment: No smokers in home  Substance Use Topics   Alcohol use: No   Drug use: No   Allergies  Allergen Reactions   Latex Other (See Comments)    Skin irritation   Amoxicillin-Pot Clavulanate Diarrhea and Nausea And Vomiting    Severe    Tape Rash    Plastic Tape     Medications: Outpatient  Medications Prior to Visit  Medication Sig   albuterol (VENTOLIN HFA) 108 (90 Base) MCG/ACT inhaler Inhale 2 puffs into the lungs every 6 (six) hours as needed for wheezing or shortness of breath. For shortness of breath   Beclomethasone Dipropionate 80 MCG/ACT AERS Place 2 sprays into the nose daily.   levocetirizine (XYZAL) 5 MG tablet Take 1 tablet (5 mg total) by mouth every evening.   meclizine (ANTIVERT) 12.5 MG tablet Take 1 tablet (12.5 mg total) by mouth 3 (three) times daily as needed for dizziness.   methylphenidate (CONCERTA) 54 MG PO CR tablet Take 1 tablet (54 mg total) by mouth every morning.   sertraline (ZOLOFT) 100 MG tablet Take 1 tablet (100 mg total) by mouth daily.   No facility-administered medications prior to visit.    Review of Systems  Constitutional: Negative.  Negative for fatigue and fever.  HENT: Positive for ear pain. Negative for ear discharge, hearing loss, rhinorrhea and sore throat.   Respiratory: Negative for cough.   Gastrointestinal: Negative for abdominal pain, diarrhea and vomiting.  Musculoskeletal: Negative for neck pain.  Skin: Negative for rash.  Neurological: Negative for dizziness and headaches.        Objective    BP 131/89 (BP Location: Right Arm, Patient Position: Sitting, Cuff Size: Large)  Pulse 75    Temp 98.4 F (36.9 C) (Oral)    Wt 230 lb (104.3 kg)    BMI 33.00 kg/m    Physical Exam Vitals reviewed.  Constitutional:      General: He is not in acute distress.    Appearance: Normal appearance. He is well-developed and well-nourished. He is not ill-appearing.  HENT:     Head: Normocephalic and atraumatic.     Right Ear: Tympanic membrane, ear canal and external ear normal. There is impacted cerumen.     Left Ear: Tympanic membrane, ear canal and external ear normal. There is impacted cerumen.  Eyes:     Extraocular Movements: EOM normal.     Conjunctiva/sclera: Conjunctivae normal.  Pulmonary:     Effort:  Pulmonary effort is normal. No respiratory distress.  Musculoskeletal:     Cervical back: Normal range of motion and neck supple. No tenderness.  Lymphadenopathy:     Cervical: No cervical adenopathy.  Neurological:     Mental Status: He is alert.  Psychiatric:        Mood and Affect: Mood and affect normal.        Behavior: Behavior normal.        Thought Content: Thought content normal.        Judgment: Judgment normal.     No results found for any visits on 09/21/20.  Assessment & Plan     1. Screen for STD (sexually transmitted disease) Will check labs as below and f/u pending results. - HIV antibody (with reflex) - Hepatic function panel - RPR w/reflex to TrepSure  2. Vitamin D deficiency Will check labs as below and f/u pending results. - Vitamin D (25 hydroxy)  3. Family history of B12 deficiency Will check labs as below and f/u pending results. - B12  4. Bilateral impacted cerumen Lavage successful. TM viewed and clear. No signs of infection.  - Ear Lavage   No follow-ups on file.      Reynolds Bowl, PA-C, have reviewed all documentation for this visit. The documentation on 09/21/20 for the exam, diagnosis, procedures, and orders are all accurate and complete.   Rubye Beach  [facility] [phone] (phone) [phone] (fax)  [place]

## 2020-09-24 ENCOUNTER — Telehealth: Payer: Self-pay

## 2020-09-24 LAB — HEPATIC FUNCTION PANEL
ALT: 23 IU/L (ref 0–44)
AST: 22 IU/L (ref 0–40)
Albumin: 4.9 g/dL (ref 4.1–5.2)
Alkaline Phosphatase: 66 IU/L (ref 51–125)
Bilirubin Total: 0.6 mg/dL (ref 0.0–1.2)
Bilirubin, Direct: 0.15 mg/dL (ref 0.00–0.40)
Total Protein: 7 g/dL (ref 6.0–8.5)

## 2020-09-24 LAB — T PALLIDUM ANTIBODY, EIA: T pallidum Antibody, EIA: NEGATIVE

## 2020-09-24 LAB — VITAMIN D 25 HYDROXY (VIT D DEFICIENCY, FRACTURES): Vit D, 25-Hydroxy: 16 ng/mL — ABNORMAL LOW (ref 30.0–100.0)

## 2020-09-24 LAB — RPR W/REFLEX TO TREPSURE: RPR: NONREACTIVE

## 2020-09-24 LAB — HIV ANTIBODY (ROUTINE TESTING W REFLEX): HIV Screen 4th Generation wRfx: NONREACTIVE

## 2020-09-24 LAB — VITAMIN B12: Vitamin B-12: 363 pg/mL (ref 232–1245)

## 2020-09-24 NOTE — Telephone Encounter (Signed)
Imaging called to make sure you were aware of his results as MRI was recommended

## 2020-09-24 NOTE — Telephone Encounter (Signed)
Yes. I am awaiting patient response

## 2020-09-25 ENCOUNTER — Other Ambulatory Visit: Payer: Self-pay | Admitting: Physician Assistant

## 2020-09-25 DIAGNOSIS — F900 Attention-deficit hyperactivity disorder, predominantly inattentive type: Secondary | ICD-10-CM

## 2020-09-25 NOTE — Telephone Encounter (Signed)
Requested medication (s) are due for refill today: no  Requested medication (s) are on the active medication list: yes  Last refill:  08/09/2020  Future visit scheduled: no  Notes to clinic:  this refill cannot be delegated    Requested Prescriptions  Pending Prescriptions Disp Refills   CONCERTA 54 MG CR tablet [Pharmacy Med Name: CONCERTA 54 MG TABLET ER 54 Tablet] 30 tablet 0    Sig: TAKE 1 [place].      Not Delegated - Psychiatry:  Stimulants/ADHD Failed - 09/25/2020 12:36 PM      Failed - This refill cannot be delegated      Failed - Urine Drug Screen completed in last 360 days      Passed - Valid encounter within last 3 months    Recent Outpatient Visits           4 days ago Screen for STD (sexually transmitted disease)   [place], [place], [place]   1 week ago Right wrist pain   [place], [place]   2 months ago Dizziness   [place], PA-C   3 months ago Attention deficit hyperactivity disorder (ADHD), predominantly inattentive type   [place], [place]   4 months ago Attention deficit hyperactivity disorder (ADHD), combined type   [facility], [place], [place]

## 2020-09-26 ENCOUNTER — Other Ambulatory Visit: Payer: Self-pay | Admitting: Physician Assistant

## 2020-09-26 DIAGNOSIS — D1611 Benign neoplasm of short bones of right upper limb: Secondary | ICD-10-CM

## 2020-09-26 NOTE — Progress Notes (Signed)
Osteochondroma of right wrist; referral placed back to Dr. Sharol Given.

## 2020-10-12 DIAGNOSIS — H5213 Myopia, bilateral: Secondary | ICD-10-CM | POA: Diagnosis not present

## 2020-10-12 DIAGNOSIS — H52223 Regular astigmatism, bilateral: Secondary | ICD-10-CM | POA: Diagnosis not present

## 2020-10-12 DIAGNOSIS — H5359 Other color vision deficiencies: Secondary | ICD-10-CM | POA: Diagnosis not present

## 2020-10-18 ENCOUNTER — Ambulatory Visit (INDEPENDENT_AMBULATORY_CARE_PROVIDER_SITE_OTHER): Payer: 59 | Admitting: Psychology

## 2020-10-18 DIAGNOSIS — F4323 Adjustment disorder with mixed anxiety and depressed mood: Secondary | ICD-10-CM | POA: Diagnosis not present

## 2020-10-25 MED FILL — DESCOVY 200-25 MG TABS: 200-25 | 30 days supply | Qty: 30 | Fill #3

## 2020-11-15 ENCOUNTER — Other Ambulatory Visit (HOSPITAL_COMMUNITY): Payer: Self-pay

## 2020-11-19 ENCOUNTER — Other Ambulatory Visit (HOSPITAL_COMMUNITY): Payer: Self-pay

## 2020-11-21 ENCOUNTER — Other Ambulatory Visit (HOSPITAL_COMMUNITY): Payer: Self-pay

## 2020-11-22 ENCOUNTER — Ambulatory Visit (INDEPENDENT_AMBULATORY_CARE_PROVIDER_SITE_OTHER): Payer: 59 | Admitting: Psychology

## 2020-11-22 DIAGNOSIS — F4323 Adjustment disorder with mixed anxiety and depressed mood: Secondary | ICD-10-CM | POA: Diagnosis not present

## 2020-11-23 ENCOUNTER — Other Ambulatory Visit (HOSPITAL_COMMUNITY): Payer: Self-pay

## 2020-11-23 MED FILL — Emtricitabine-Tenofovir Alafenamide Fumarate Tab 200-25 MG: ORAL | 30 days supply | Qty: 30 | Fill #0 | Status: AC

## 2020-12-05 ENCOUNTER — Telehealth: Payer: Self-pay

## 2020-12-05 NOTE — Telephone Encounter (Signed)
I put pt on the schedule for a mychart visit at 340.  His mother states that due to not having insurance through the college that he could not go to the clinic on campus and due to his insurance through [employer] he needed to see someone in [facility] network.

## 2020-12-05 NOTE — Telephone Encounter (Signed)
Patient's mother Joshua Yang) is wanting to know if patient can be worked in today or tomorrow for a mychart visit for a possible ear infection.  He is away at college.  She wants to make sure you know that pt has a history of ear infections and that his eardrums have ruptured multiple times because of the ear infections.  Please call pt if able to be worked in.

## 2020-12-05 NOTE — Telephone Encounter (Signed)
Please advise 

## 2020-12-06 ENCOUNTER — Telehealth (INDEPENDENT_AMBULATORY_CARE_PROVIDER_SITE_OTHER): Payer: 59 | Admitting: Family Medicine

## 2020-12-06 DIAGNOSIS — H6691 Otitis media, unspecified, right ear: Secondary | ICD-10-CM | POA: Diagnosis not present

## 2020-12-06 MED ORDER — AMOXICILLIN 500 MG PO CAPS: 500.0000 mg | ORAL_CAPSULE | Freq: Three times a day (TID) | ORAL | 0 refills | Status: AC

## 2020-12-06 NOTE — Telephone Encounter (Signed)
Noted  

## 2020-12-06 NOTE — Progress Notes (Signed)
MyChart Video Visit    Virtual Visit via Video Note   This visit type was conducted due to national recommendations for restrictions regarding the COVID-19 Pandemic (e.g. social distancing) in an effort to limit this patient's exposure and mitigate transmission in our community. This patient is at least at moderate risk for complications without adequate follow up. This format is felt to be most appropriate for this patient at this time. Physical exam was limited by quality of the video and audio technology used for the visit.   Patient location: Home Provider location: Office  I discussed the limitations of evaluation and management by telemedicine and the availability of in person appointments. The patient expressed understanding and agreed to proceed.  Patient: Joshua Yang   DOB: 22-Feb-2001   20 y.o. Male  MRN: [id number] Visit Date: 12/06/2020  Today's healthcare provider: Wilhemena Durie, MD   No chief complaint on file.  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  He felt a pop and then felt some drainage from his right ear.  He has had ear infections before treated successfully with amoxicillin.  No known exposure to COVID and he has no cough or chest symptoms.  No fever headache or myalgias.     Medications: Outpatient Medications Prior to Visit  Medication Sig  . albuterol (VENTOLIN HFA) 108 (90 Base) MCG/ACT inhaler Inhale 2 puffs into the lungs every 6 (six) hours as needed for wheezing or shortness of breath. For shortness of breath  . Beclomethasone Dipropionate 80 MCG/ACT AERS Place 2 sprays into the nose daily.  . CONCERTA 54 MG CR tablet TAKE 1 TABLET BY MOUTH EVERY MORNING.  [name] emtricitabine-tenofovir AF (DESCOVY) 200-25 MG tablet TAKE 1 TABLET BY MOUTH DAILY.  [name] levocetirizine (XYZAL) 5 MG tablet Take 1 tablet (5 mg total) by mouth every evening.  . meclizine (ANTIVERT) 12.5 MG  tablet Take 1 tablet (12.5 mg total) by mouth 3 (three) times daily as needed for dizziness.  . methylphenidate 54 MG PO CR tablet TAKE 1 TABLET BY MOUTH EVERY MORNING.  . methylphenidate 54 MG PO CR tablet TAKE 1 TABLET BY MOUTH EVERY MORNING.  . methylphenidate 54 MG PO CR tablet TAKE 1 TABLET BY MOUTH EVERY MORNING  . sertraline (ZOLOFT) 100 MG tablet Take 1 tablet (100 mg total) by mouth daily.   No facility-administered medications prior to visit.    Review of Systems     Objective    There were no vitals taken for this visit.    Physical Exam  Patient is a pleasant cooperative and in no acute distress during the interview.   Assessment & Plan     1. Right otitis media, unspecified otitis media type Treat with Amoxil for 10 days and also will use Sudafed once or twice a day for a couple of days to help decongest.  He states Amoxil is helped him the most in the past.   No follow-ups on file.     I discussed the assessment and treatment plan with the patient. The patient was provided an opportunity to ask questions and all were answered. The patient agreed with the plan and demonstrated an understanding of the instructions.   The patient was advised to call back or seek an in-person evaluation if the symptoms worsen or if the condition fails to improve as anticipated.  I provided 10 minutes of  non-face-to-face time during this encounter.  I, Wilhemena Durie, MD, have reviewed all documentation for this visit. The documentation on 12/06/20 for the exam, diagnosis, procedures, and orders are all accurate and complete.   Richard Cranford Mon, MD [facility] [phone] (phone) [phone] (fax)  [place]

## 2020-12-17 ENCOUNTER — Other Ambulatory Visit (HOSPITAL_COMMUNITY): Payer: Self-pay

## 2020-12-19 ENCOUNTER — Other Ambulatory Visit (HOSPITAL_COMMUNITY): Payer: Self-pay

## 2020-12-20 ENCOUNTER — Ambulatory Visit (INDEPENDENT_AMBULATORY_CARE_PROVIDER_SITE_OTHER): Payer: 59 | Admitting: Psychology

## 2020-12-20 DIAGNOSIS — F4323 Adjustment disorder with mixed anxiety and depressed mood: Secondary | ICD-10-CM | POA: Diagnosis not present

## 2020-12-21 ENCOUNTER — Other Ambulatory Visit (HOSPITAL_COMMUNITY): Payer: Self-pay

## 2021-01-17 ENCOUNTER — Other Ambulatory Visit: Payer: Self-pay

## 2021-01-17 ENCOUNTER — Ambulatory Visit (INDEPENDENT_AMBULATORY_CARE_PROVIDER_SITE_OTHER): Payer: 59 | Admitting: Psychology

## 2021-01-17 ENCOUNTER — Other Ambulatory Visit: Payer: Self-pay | Admitting: Family Medicine

## 2021-01-17 DIAGNOSIS — F4323 Adjustment disorder with mixed anxiety and depressed mood: Secondary | ICD-10-CM | POA: Diagnosis not present

## 2021-01-17 NOTE — Telephone Encounter (Signed)
Requested medication (s) are due for refill today: yes  Requested medication (s) are on the active medication list: yes  Last refill:  09/25/20  Future visit scheduled: no  Notes to clinic:  not delegated    Requested Prescriptions  Pending Prescriptions Disp Refills   METHYLPHENIDATE 54 MG PO CR tablet [Pharmacy Med Name: methylphenidate (CONCERTA) 54 MG CR tablet] 30 tablet 0    Sig: TAKE 1 TABLET BY MOUTH EVERY MORNING.      Not Delegated - Psychiatry:  Stimulants/ADHD Failed - 01/17/2021  2:19 PM      Failed - This refill cannot be delegated      Failed - Urine Drug Screen completed in last 360 days      Passed - Valid encounter within last 3 months    Recent Outpatient Visits           1 month ago Right otitis media, unspecified otitis media type   [facility] Jerrol Banana., MD   3 months ago Screen for STD (sexually transmitted disease)   [place], [place]   4 months ago Right wrist pain   [place], [place]   6 months ago Dizziness   [place], PA-C   7 months ago Attention deficit hyperactivity disorder (ADHD), predominantly inattentive type   [facility], [place], [place]

## 2021-01-18 ENCOUNTER — Other Ambulatory Visit: Payer: Self-pay

## 2021-01-18 MED ORDER — LEVOCETIRIZINE DIHYDROCHLORIDE 5 MG PO TABS
ORAL_TABLET | ORAL | 3 refills | Status: DC
  Filled 2021-01-18: qty 90, 90d supply, fill #0
  Filled 2021-10-31: qty 90, 90d supply, fill #1

## 2021-01-18 MED FILL — Methylphenidate HCl Tab ER Osmotic Release (OSM) 54 MG: ORAL | 30 days supply | Qty: 30 | Fill #0 | Status: AC

## 2021-01-21 ENCOUNTER — Other Ambulatory Visit: Payer: Self-pay

## 2021-01-23 ENCOUNTER — Other Ambulatory Visit: Payer: Self-pay

## 2021-02-14 ENCOUNTER — Ambulatory Visit (INDEPENDENT_AMBULATORY_CARE_PROVIDER_SITE_OTHER): Payer: 59 | Admitting: Psychology

## 2021-02-14 DIAGNOSIS — F4323 Adjustment disorder with mixed anxiety and depressed mood: Secondary | ICD-10-CM | POA: Diagnosis not present

## 2021-03-21 ENCOUNTER — Ambulatory Visit: Payer: 59 | Admitting: Psychology

## 2021-03-27 ENCOUNTER — Ambulatory Visit (INDEPENDENT_AMBULATORY_CARE_PROVIDER_SITE_OTHER): Payer: 59 | Admitting: Psychology

## 2021-03-27 DIAGNOSIS — F4323 Adjustment disorder with mixed anxiety and depressed mood: Secondary | ICD-10-CM

## 2021-04-14 IMAGING — CR DG WRIST COMPLETE 3+V*R*
1 series · 4 of 4 positions shown · non-contrast
Comparison: 03/16/2017

CLINICAL DATA: Pain approximately 2 inches proximal to right wrist,
history of osteo chondroma

EXAM:
RIGHT WRIST - COMPLETE 3+ VIEW

[Series 1: dg wrist complete right · 0.14mm/px · 4 of 4 slices shown]
[im 1/4]
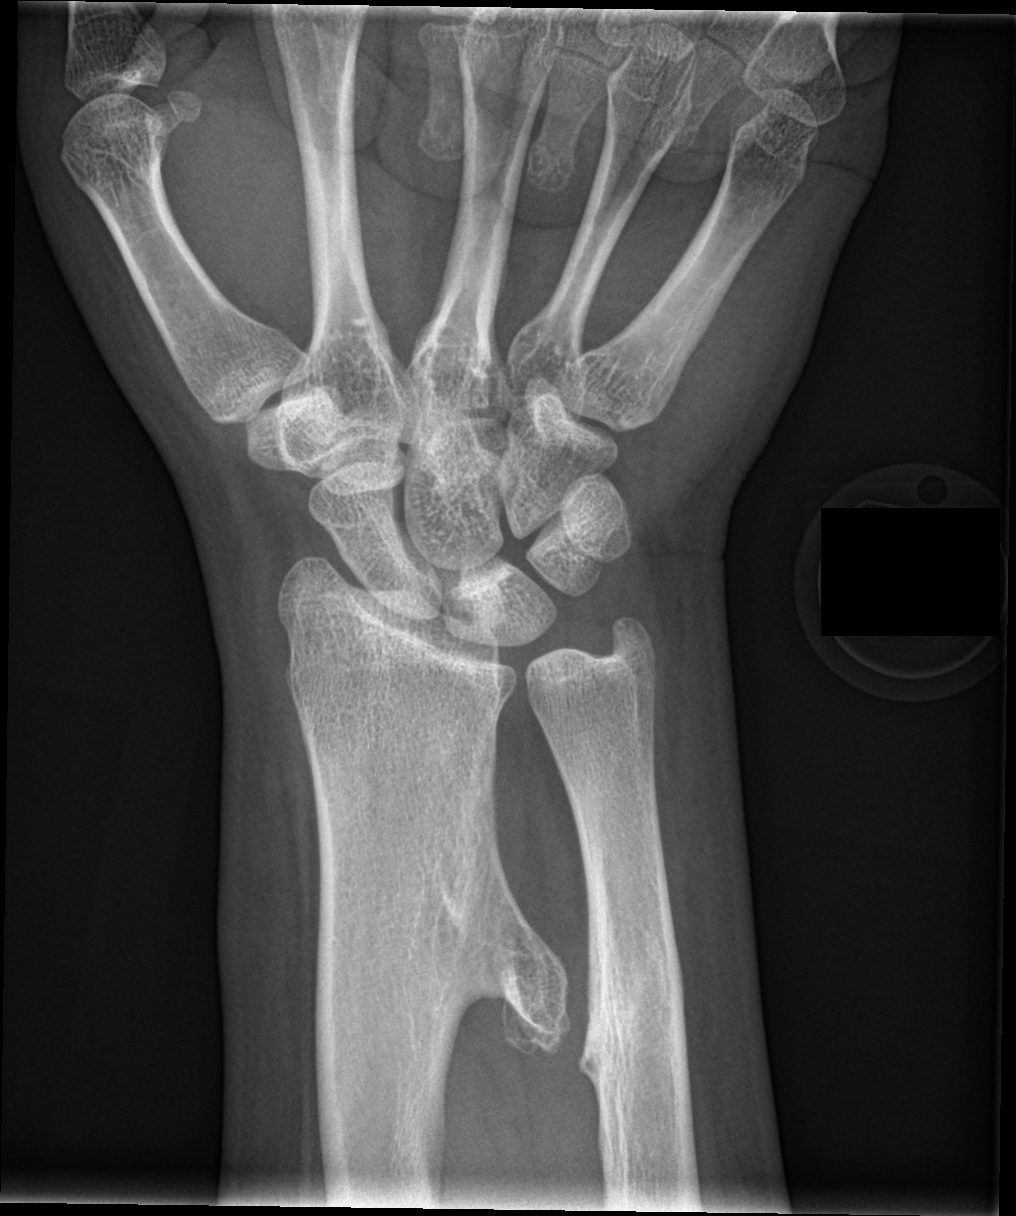
[im 2/4]
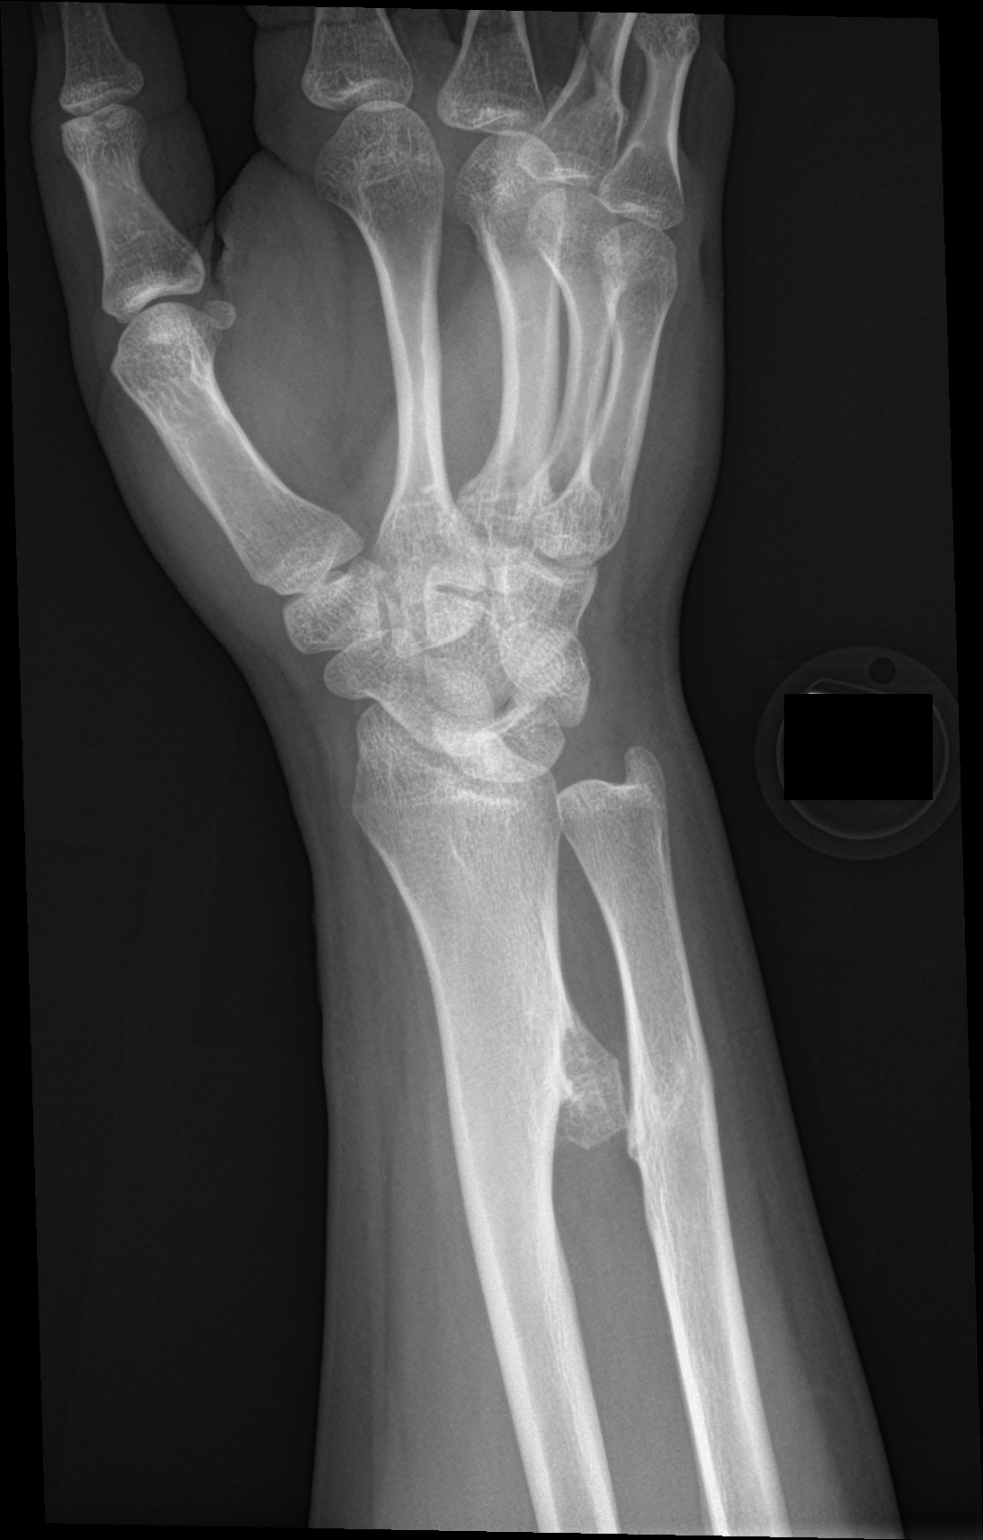
[im 3/4]
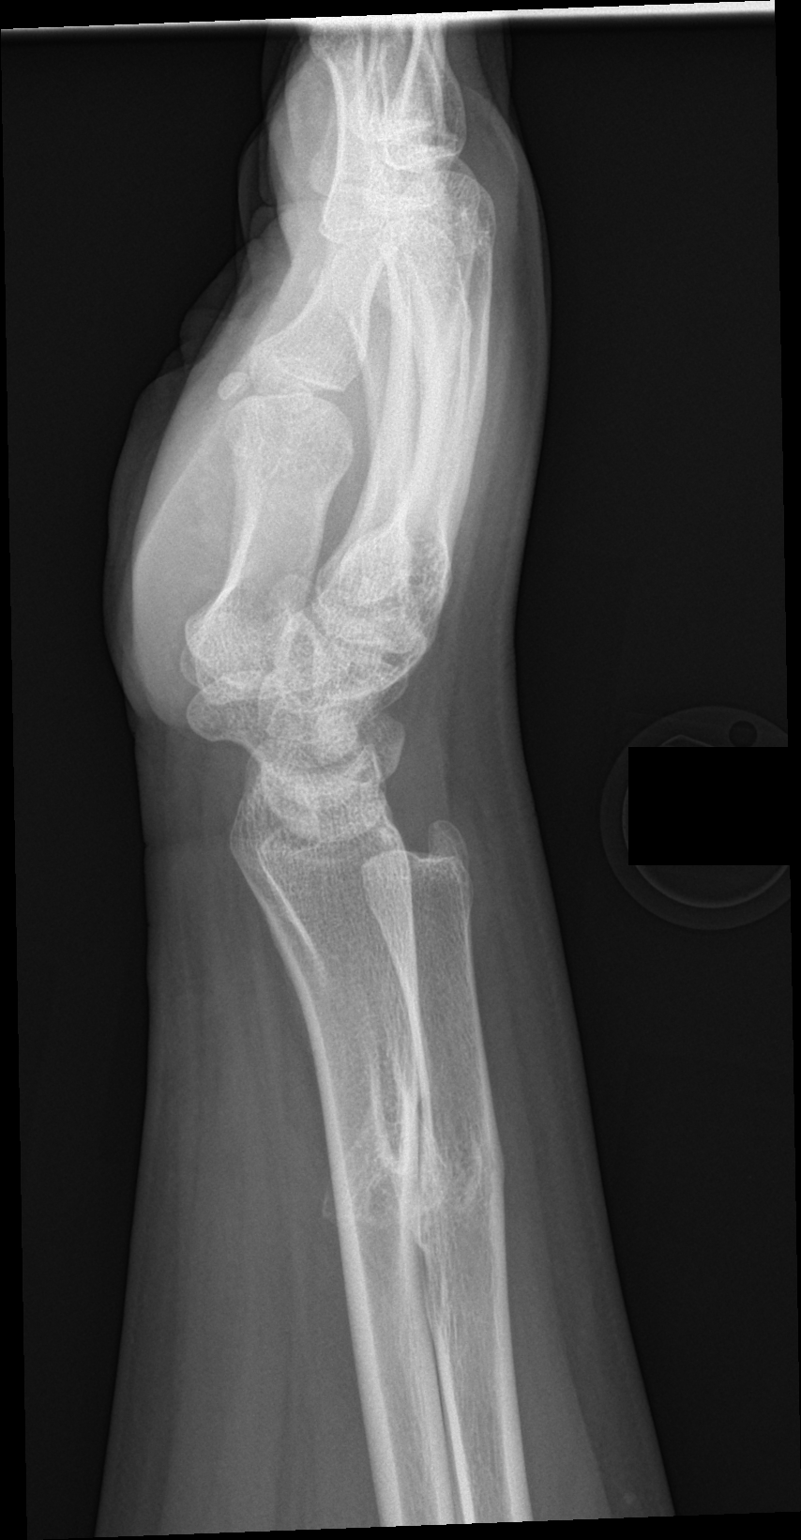
[im 4/4]
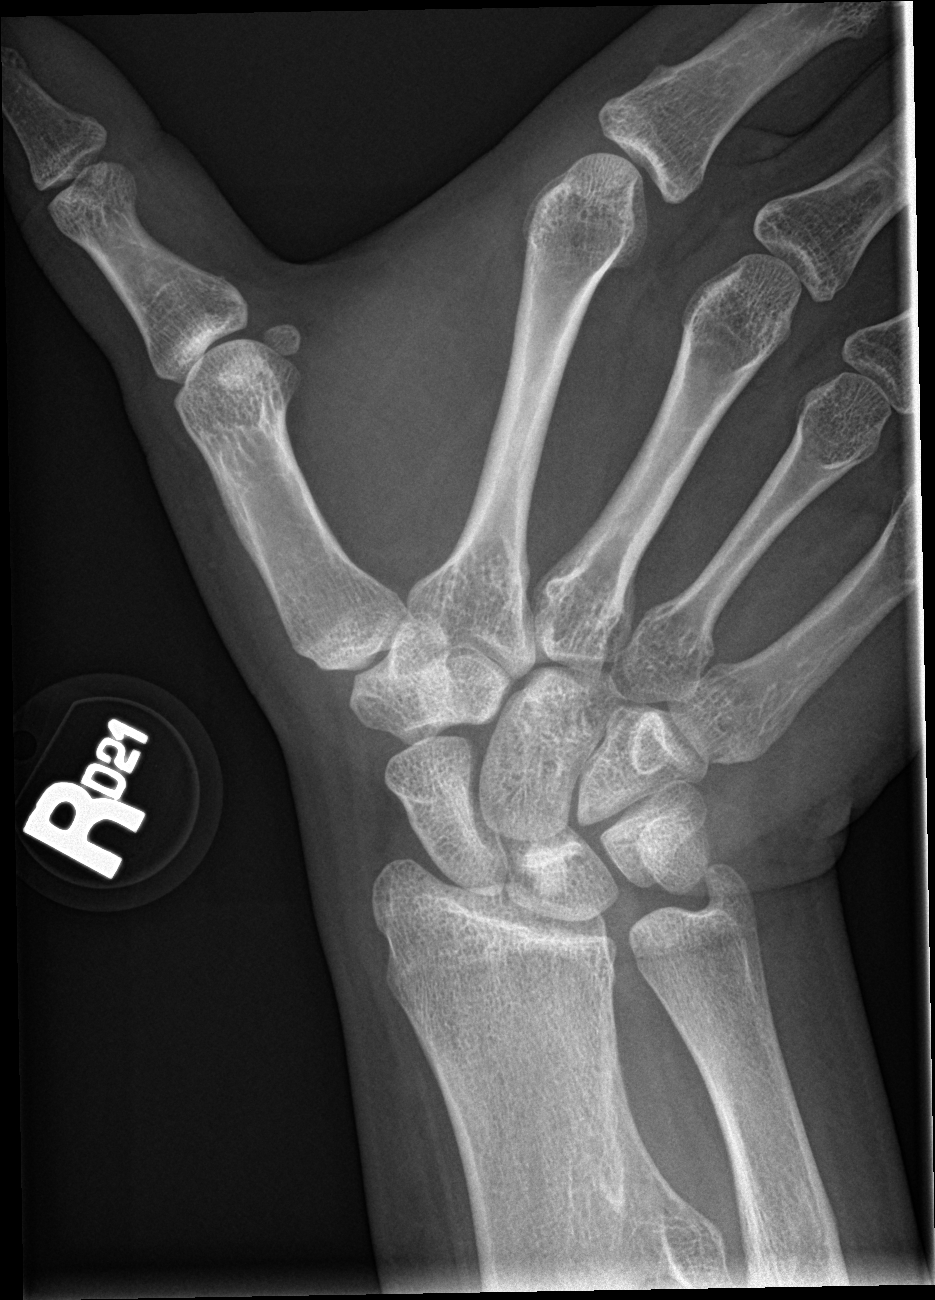

[4 of 4 positions shown; findings below may reference images not displayed]

FINDINGS: Frontal, oblique, lateral, and ulnar deviated views of the right
wrist are obtained. No fracture, subluxation, or dislocation. Joint
spaces are well preserved.

There is an exostosis off the ulnar aspect of the distal radial
metadiaphyseal junction, consistent with osteochondroma. Given the
complaint of pain, MRI may be useful for further evaluation.
IMPRESSION: 1. Osteochondroma of the distal right radius. Given pain at
location, MRI follow-up may be useful to evaluate for malignant
degeneration.
2. Otherwise unremarkable right wrist.

These results will be called to the ordering clinician or
representative by the Radiologist Assistant, and communication
documented in the PACS or [REDACTED].

## 2021-04-15 ENCOUNTER — Other Ambulatory Visit (HOSPITAL_COMMUNITY): Payer: Self-pay

## 2021-04-15 ENCOUNTER — Encounter: Payer: Self-pay | Admitting: Pharmacist

## 2021-04-19 ENCOUNTER — Other Ambulatory Visit (HOSPITAL_BASED_OUTPATIENT_CLINIC_OR_DEPARTMENT_OTHER): Payer: Self-pay

## 2021-04-23 ENCOUNTER — Other Ambulatory Visit (HOSPITAL_COMMUNITY): Payer: Self-pay

## 2021-04-25 ENCOUNTER — Ambulatory Visit: Payer: 59 | Admitting: Psychology

## 2021-04-30 ENCOUNTER — Ambulatory Visit (INDEPENDENT_AMBULATORY_CARE_PROVIDER_SITE_OTHER): Payer: 59 | Admitting: Psychology

## 2021-04-30 DIAGNOSIS — F4323 Adjustment disorder with mixed anxiety and depressed mood: Secondary | ICD-10-CM

## 2021-05-30 ENCOUNTER — Ambulatory Visit: Payer: 59 | Admitting: Psychology

## 2021-06-04 ENCOUNTER — Ambulatory Visit (INDEPENDENT_AMBULATORY_CARE_PROVIDER_SITE_OTHER): Payer: 59 | Admitting: Psychology

## 2021-06-04 DIAGNOSIS — F4323 Adjustment disorder with mixed anxiety and depressed mood: Secondary | ICD-10-CM

## 2021-06-27 ENCOUNTER — Ambulatory Visit (INDEPENDENT_AMBULATORY_CARE_PROVIDER_SITE_OTHER): Payer: 59 | Admitting: Psychology

## 2021-06-27 DIAGNOSIS — F4323 Adjustment disorder with mixed anxiety and depressed mood: Secondary | ICD-10-CM | POA: Diagnosis not present

## 2021-07-25 ENCOUNTER — Ambulatory Visit (INDEPENDENT_AMBULATORY_CARE_PROVIDER_SITE_OTHER): Payer: 59 | Admitting: Psychology

## 2021-07-25 DIAGNOSIS — F4323 Adjustment disorder with mixed anxiety and depressed mood: Secondary | ICD-10-CM

## 2021-07-25 NOTE — Progress Notes (Signed)
[place] Counselor/Therapist Progress Note  Patient ID: Joshua Yang, MRN: [id number],    Date: 07/25/2021  Time Spent: 60   Treatment Type: Individual Therapy  Reported Symptoms: anxiety  Mental Status Exam: Appearance:  Casual     Behavior: Appropriate  Motor: Normal  Speech/Language:  Normal Rate  Affect: Appropriate  Mood: normal  Thought process: normal  Thought content:   WNL  Sensory/Perceptual disturbances:   WNL  Orientation: oriented to person, place, and time/date  Attention: Good  Concentration: Good  Memory: WNL  Fund of knowledge:  Good  Insight:   Good  Judgment:  Good  Impulse Control: Good   Risk Assessment: Danger to Self:  No Self-injurious Behavior: No Danger to Others: No Duty to Warn:no Physical Aggression / Violence:No  Access to Firearms a concern: No  Gang Involvement:No   Subjective:   Pt present for face-to-face individual therapy via video Webex.   He consented to telehealth video session due to COVID-19 pandemic.    Location of pt: home Location of therapist: home office.   Pt talked about school.  He is done with classes for the semester and now has 4 final exams this next week.    Pt feels he will do fine on the exams and just wants the semester to be over.  Pt is thinking about taking a gap year after this year and eventually transferring to a college in [place] so he will be closer to friends.   Pt states he has some anxiety about going home for Christmas.  He feels it will be anxiety producing bc of the family dynamics.  Pt's brother is not talking to his sister.  Pt's sister is thinking about not coming home for Christmas.  Pt talked about the family drama and how it affects him.  He feels put in the middle at times.  Helped pt process his feelings and family dynamics.   Pt talked about his job.  He loves working for [employer] but the management at the [place] location is not good.  Addressed pt's  frustrations.   Provided supportive therapy.    Interventions: Cognitive Behavioral Therapy and Insight-Oriented  Diagnosis: F43.23  Plan: See pt's Treatment Plan for anxiety and depression in Therapy Charts.   (Treatment Plan Target Date:  11/22/2021) Plan to see pt in a month.  Zain Bingman, LCSW

## 2021-08-22 ENCOUNTER — Ambulatory Visit (INDEPENDENT_AMBULATORY_CARE_PROVIDER_SITE_OTHER): Payer: 59 | Admitting: Psychology

## 2021-08-22 DIAGNOSIS — F4323 Adjustment disorder with mixed anxiety and depressed mood: Secondary | ICD-10-CM

## 2021-08-22 NOTE — Progress Notes (Signed)
[place] Counselor/Therapist Progress Note  Patient ID: Joshua Yang, MRN: [id number],    Date: 08/22/2021  Time Spent: 2:00pm-3:00pm   60 minutes  Treatment Type: Individual Therapy  Reported Symptoms: anxiety, frustration  Mental Status Exam: Appearance:  Casual     Behavior: Appropriate  Motor: Normal  Speech/Language:  Normal Rate  Affect: Appropriate  Mood: normal  Thought process: normal  Thought content:   WNL  Sensory/Perceptual disturbances:   WNL  Orientation: oriented to person, place, and time/date  Attention: Good  Concentration: Good  Memory: WNL  Fund of knowledge:  Good  Insight:   Good  Judgment:  Good  Impulse Control: Good   Risk Assessment: Danger to Self:  No Self-injurious Behavior: No Danger to Others: No Duty to Warn:no Physical Aggression / Violence:No  Access to Firearms a concern: No  Gang Involvement:No   Subjective:   Pt present for face-to-face individual therapy via video Webex.   He consented to telehealth video session due to COVID-19 pandemic.    Location of pt: home Location of therapist: home office.   Pt talked about his Christmas vacation.  He said it was "interesting" but there was some family drama with his mother's family.   Addressed the issues and how they affected pt.   Helped him process family dynamics.   Pt talked about work.   There is a colleague [place] he tends to get frustrated with.   Pt talked about the issues with Mindy.  He states she is rigid and controlling.   Addressed how pt can relate and communicate with her effectively.  Pt talked about his frustrations with his manager and feels like his concerns were not adequately addressed.   Helped pt process his feelings and problem solve work dynamics.   Pt talked about college.  He plans to finish this semester at [employer] and then take a gap year to "reflect and take time for himself".   Pt wants to then return to school locally where he  has a better support system.  Pt is not sure how his parents will react and he is concerned about telling them so he plans to wait awhile.  Plan to help pt address this issue in future sessions.   Provided supportive therapy.    Interventions: Cognitive Behavioral Therapy and Insight-Oriented  Diagnosis: F43.23  Plan: See pt's Treatment Plan for anxiety and depression in Therapy Charts.   (Treatment Plan Target Date:  11/22/2021) Pt continues to progress toward treatment goals.  Plan to see pt in a month.   Alon Mazor, LCSW

## 2021-09-19 ENCOUNTER — Ambulatory Visit (INDEPENDENT_AMBULATORY_CARE_PROVIDER_SITE_OTHER): Payer: 59 | Admitting: Psychology

## 2021-09-19 DIAGNOSIS — F4323 Adjustment disorder with mixed anxiety and depressed mood: Secondary | ICD-10-CM | POA: Diagnosis not present

## 2021-09-19 NOTE — Progress Notes (Signed)
[place] Counselor/Therapist Progress Note  Patient ID: Joshua Yang, MRN: [id number],    Date: 09/19/2021  Time Spent: 2:00pm-3:00pm   60 minutes  Treatment Type: Individual Therapy  Reported Symptoms: anxiety  Mental Status Exam: Appearance:  Casual     Behavior: Appropriate  Motor: Normal  Speech/Language:  Normal Rate  Affect: Appropriate  Mood: normal  Thought process: normal  Thought content:   WNL  Sensory/Perceptual disturbances:   WNL  Orientation: oriented to person, place, and time/date  Attention: Good  Concentration: Good  Memory: WNL  Fund of knowledge:  Good  Insight:   Good  Judgment:  Good  Impulse Control: Good   Risk Assessment: Danger to Self:  No Self-injurious Behavior: No Danger to Others: No Duty to Warn:no Physical Aggression / Violence:No  Access to Firearms a concern: No  Gang Involvement:No   Subjective:   Pt present for face-to-face individual therapy via video Webex.   He consented to telehealth video session due to COVID-19 pandemic.    Location of pt: home Location of therapist: home office.   Pt talked about college.  All of his 3 classes are online so he decided to move out of the dorm and has moved back home.   He will be going back and forth between his parents home.  Pt's parents are supportive of him moving back home.   Pt talked with his mother about wanting to take a gap year from college.  He decided to wait to make a permanent decision until he sees how this semester goes with moving back home.   He was able to transfer work to the [employer] in [place].   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  Pt thinks that working at the [place] location will be better for him than [employer].   Pt was summoned for jury duty.  He will have to go March 9th.  Pt talked about having a lot of anxiety the other day.  He was in a grocery store and is not sure what triggered the anxiety.   It lasted for about an  hour.  He did not think to utilize calming strategies.  Worked with pt on calming strategies he can use.   Reminded him about how to use diaphramatic breathing.   Pt is worried about his Calculus class.  Math is very challenging for him.  He needs tutoring but does not like admitting that he needs help and asking for help.   Provided supportive therapy.    Interventions: Cognitive Behavioral Therapy and Insight-Oriented  Diagnosis: F43.23  Plan: See pt's Treatment Plan for anxiety and depression in Therapy Charts.   (Treatment Plan Target Date:  11/22/2021) Pt continues to progress toward treatment goals.  Plan to see pt in a month.   Allisson Schindel, LCSW

## 2021-10-24 ENCOUNTER — Ambulatory Visit: Payer: 59 | Admitting: Psychology

## 2021-10-28 ENCOUNTER — Ambulatory Visit (INDEPENDENT_AMBULATORY_CARE_PROVIDER_SITE_OTHER): Payer: 59 | Admitting: Psychology

## 2021-10-28 DIAGNOSIS — F4323 Adjustment disorder with mixed anxiety and depressed mood: Secondary | ICD-10-CM

## 2021-10-28 NOTE — Progress Notes (Signed)
[place] Counselor/Therapist Progress Note ? ?Patient ID: Joshua Yang, MRN: [id number],   ? ?Date: 10/28/2021 ? ?Time Spent: 11:00am-11:50am   50 minutes ? ?Treatment Type: Individual Therapy ? ?Reported Symptoms: anxiety ? ?Mental Status Exam: ?Appearance:  Casual     ?Behavior: Appropriate  ?Motor: Normal  ?Speech/Language:  Normal Rate  ?Affect: Appropriate  ?Mood: normal  ?Thought process: normal  ?Thought content:   WNL  ?Sensory/Perceptual disturbances:   WNL  ?Orientation: oriented to person, place, and time/date  ?Attention: Good  ?Concentration: Good  ?Memory: WNL  ?Fund of knowledge:  Good  ?Insight:   Good  ?Judgment:  Good  ?Impulse Control: Good  ? ?Risk Assessment: ?Danger to Self:  No ?Self-injurious Behavior: No ?Danger to Others: No ?Duty to Warn:no ?Physical Aggression / Violence:No  ?Access to Firearms a concern: No  ?Gang Involvement:No  ? ?Subjective:   ?Pt present for face-to-face individual therapy via video Webex.   He consented to telehealth video session due to COVID-19 pandemic.    ?Location of pt: home ?Location of therapist: home office.   ?Pt talked about being back home.  Pt's parents are concerned that he does well in school.   Pt states he is doing well in his classes.   Pt likes being home and being able to see his friends. ?Pt talked about work.  He was promoted to shift lead.  Pt is really happy with the promotion.   ?Pt has experienced some anxiety about his shift lead responsibilities.  He gets anxious that he will forget to do something when he is locking up.  Addressed strategies pt can put into place to help him remember what to do.    ?Worked with pt on calming strategies he can use.   Reminded him about how to use diaphramatic breathing.   ?Provided supportive therapy.   ? ?Interventions: Cognitive Behavioral Therapy and Insight-Oriented ? ?Diagnosis: F43.23 ? ?Plan: See pt's Treatment Plan for anxiety and depression in Therapy Charts.    (Treatment Plan Target Date:  11/22/2021) ?Pt continues to progress toward treatment goals.  ?Plan to see pt in a month.  ? ?Clint Bolder, LCSW ? ? ?

## 2021-10-31 ENCOUNTER — Other Ambulatory Visit: Payer: Self-pay | Admitting: Family Medicine

## 2021-10-31 ENCOUNTER — Other Ambulatory Visit (HOSPITAL_COMMUNITY): Payer: Self-pay

## 2021-11-01 ENCOUNTER — Other Ambulatory Visit (HOSPITAL_COMMUNITY): Payer: Self-pay

## 2021-11-01 NOTE — Telephone Encounter (Signed)
Requested medications are due for refill today.  Unsure ? ?Requested medications are on the active medications list.  yes ? ?Last refill. 09/25/2020 #30 0 refills ? ?Future visit scheduled.   no ? ?Notes to clinic.  Medication not delegated.  ? ? ? ?Requested Prescriptions  ?Pending Prescriptions Disp Refills  ? methylphenidate (CONCERTA) 54 MG PO CR tablet 30 tablet 0  ?  Sig: TAKE 1 TABLET BY MOUTH EVERY MORNING.  ?  ? Not Delegated - Psychiatry:  Stimulants/ADHD Failed - 10/31/2021  7:28 PM  ?  ?  Failed - This refill cannot be delegated  ?  ?  Failed - Urine Drug Screen completed in last 360 days  ?  ?  Failed - Valid encounter within last 6 months  ?  Recent Outpatient Visits   ? ?      ? 11 months ago Right otitis media, unspecified otitis media type  ? [facility] Jerrol Banana., MD  ? 1 year ago Screen for STD (sexually transmitted disease)  ? [place], [place]  ? 1 year ago Right wrist pain  ? [place], [place]  ? 1 year ago Dizziness  ? [place], PA-C  ? 1 year ago Attention deficit hyperactivity disorder (ADHD), predominantly inattentive type  ? [place], [place]  ? ?  ?  ?Future Appointments   ? ?        ? In 3 weeks [place], Blondell Reveal, MD [facility], [facility]  ? ?  ? ?  ?  ?  Passed - Last BP in normal range  ?  BP Readings from Last 1 Encounters:  ?09/21/20 131/89  ?  ?  ?  ?  Passed - Last Heart Rate in normal range  ?  Pulse Readings from Last 1 Encounters:  ?09/21/20 75  ?  ?  ?  ?  ?  ?

## 2021-11-05 ENCOUNTER — Other Ambulatory Visit (HOSPITAL_COMMUNITY): Payer: Self-pay

## 2021-11-06 ENCOUNTER — Other Ambulatory Visit (HOSPITAL_COMMUNITY): Payer: Self-pay

## 2021-11-15 ENCOUNTER — Other Ambulatory Visit (HOSPITAL_COMMUNITY): Payer: Self-pay

## 2021-11-15 ENCOUNTER — Ambulatory Visit (HOSPITAL_BASED_OUTPATIENT_CLINIC_OR_DEPARTMENT_OTHER): Payer: 59 | Admitting: Family Medicine

## 2021-11-15 VITALS — BP 128/72 | HR 86 | Temp 98.7°F | Ht 69.0 in | Wt 231.8 lb

## 2021-11-15 DIAGNOSIS — F32A Depression, unspecified: Secondary | ICD-10-CM | POA: Diagnosis not present

## 2021-11-15 DIAGNOSIS — Z7251 High risk heterosexual behavior: Secondary | ICD-10-CM | POA: Insufficient documentation

## 2021-11-15 DIAGNOSIS — F902 Attention-deficit hyperactivity disorder, combined type: Secondary | ICD-10-CM

## 2021-11-15 DIAGNOSIS — Z79899 Other long term (current) drug therapy: Secondary | ICD-10-CM

## 2021-11-15 MED ORDER — METHYLPHENIDATE HCL ER (OSM) 36 MG PO TBCR
36.0000 mg | EXTENDED_RELEASE_TABLET | Freq: Every day | ORAL | 0 refills | Status: DC
  Filled 2021-11-15: qty 30, 30d supply, fill #0

## 2021-11-15 MED ORDER — EMTRICITABINE-TENOFOVIR AF 200-25 MG PO TABS
1.0000 | ORAL_TABLET | Freq: Every day | ORAL | 2 refills | Status: DC
  Filled 2021-11-15 (×2): qty 30, 30d supply, fill #0

## 2021-11-15 MED ORDER — SERTRALINE HCL 50 MG PO TABS
50.0000 mg | ORAL_TABLET | Freq: Every day | ORAL | 1 refills | Status: DC
  Filled 2021-11-15: qty 30, 30d supply, fill #0

## 2021-11-15 NOTE — Patient Instructions (Signed)
?  Medication Instructions:  ?Your physician recommends that you continue on your current medications as directed. Please refer to the Current Medication list given to you today. ?--If you need a refill on any your medications before your next appointment, please call your pharmacy first. If no refills are authorized on file call the office.-- ? ?Lab Work: ?Your physician has recommended that you have lab work today: today will call back with results ?If you have labs (blood work) drawn today and your tests are completely normal, you will receive your results via [place] a phone call from our staff.  ?Please ensure you check your voicemail in the event that you authorized detailed messages to be left on a delegated number. If you have any lab test that is abnormal or we need to change your treatment, we will call you to review the results. ? ? ? ?Follow-Up: ?Your next appointment:   ?Your physician recommends that you schedule a follow-up appointment in: 2 week follow up virtual is okay with Dr. [place] ? ?You will receive a text message or e-mail with a link to a survey about your care and experience with [place] today! We would greatly appreciate your feedback!  ? ?Thanks for letting us be apart of your health journey!!  ?Primary Care and Sports Medicine  ? ?Dr. Kyung Rudd [place]  ? ?We encourage you to activate your patient portal called "MyChart".  Sign up information is provided on this After Visit Summary.  MyChart is used to connect with patients for Virtual Visits (Telemedicine).  Patients are able to view lab/test results, encounter notes, upcoming appointments, etc.  Non-urgent messages can be sent to your provider as well. To learn more about what you can do with MyChart, please visit --  [email or web address].    ?

## 2021-11-15 NOTE — Assessment & Plan Note (Signed)
Currently symptomatic with PHQ-9 score of 14.  Will resume sertraline, start with lower dose of 50 mg with plan to titrate over the next 2 to 4 weeks to 100 mg as long as tolerating the medication ?Plan for close follow-up in 2 weeks to monitor response to medication assess for any SI/HI.  Did discuss potential for worsening SI/HI with initiation of SSRI.  Discussed that if this does happen to contact the office or present to local emergency department, patient aware and in agreement ?

## 2021-11-15 NOTE — Assessment & Plan Note (Signed)
Continue with discovery, new prescription sent, will check labs today for monitoring ?

## 2021-11-15 NOTE — Assessment & Plan Note (Signed)
Check labs today for monitoring ?

## 2021-11-15 NOTE — Assessment & Plan Note (Signed)
Symptoms controlled in the past with Concerta, however at higher dose it seems that he was having some adverse effects ?We will resume Concerta but at lower dose of 36 mg, prescription sent to pharmacy on file ?Reviewed PDMP, no red flags today ?

## 2021-11-15 NOTE — Progress Notes (Signed)
? ?New Patient Office Visit ? ?Subjective:  ?Patient ID: Joshua Yang, male    DOB: 22-Mar-2001  Age: 21 y.o. MRN: [id number] ? ?CC: Establish care, anxiety/depression, ADHD, PrEP therapy ? ?HPI ?Joshua Yang is a 21 year old male presenting to establish in clinic.  He has current concerns as outlined above. ? ?ADHD: Was being managed by most recent PCP.  Most recently was on Concerta 54 mg.  Felt that it helped control symptoms, however he noticed that he had elevated heart rate with medication.  Has been without his medication for several months ? ?Anxiety/depression: Was also being managed by PCP, was utilizing sertraline 100 mg to control symptoms.  Reports good control of symptoms with this dose of medication.  Has also been without this medication for several months.  Does feel as though he has increased symptoms at present.  Continues to work with a Social worker ? ?PrEP therapy: Continues with use of Descovy, has been tolerating well, reports that he has not been out of this medication.  Was being managed by PCP.  Requesting refill today, knows that labs will need to be checked related to this ? ?Patient is originally from [place] area.  He is currently working at [employer] and is also in school virtually at [employer].  Thinks that he will be transferring to a school locally such as [employer].  After work he enjoys coloring, playing video games, swimming ? ?Past Medical History:  ?Diagnosis Date  ? Allergy   ? seasonal  ? Anxiety   ?  no medications  ? Asthma   ? allergy induced asthma  ? Cellulitis   ? hx of  ? Family history of adverse reaction to anesthesia   ? mother had historu of running a high fever after surgery, stated it is not malignant hyperthermia  ? Hearing loss, conductive, bilateral   ? Impetigo   ? hx of  ? Multiple hereditary osteochondromas   ? Staph skin infection   ? Vision abnormalities   ? wears glasses  ? ? ?Past Surgical History:  ?Procedure Laterality Date  ? EAR  TUBE REMOVAL    ? OSTEOCHONDROMA EXCISION    ? x2 one on heel and one on upper thigh  ? OSTEOCHONDROMA EXCISION  03/10/2012  ? Procedure: OSTEOCHONDROMA EXCISION;  Surgeon: Newt Minion, MD;  Location: [place];  Service: Orthopedics;  Laterality: Right;  Excision distal right tibia and fibula osteochondroma  ? OSTEOCHONDROMA EXCISION Right 03/30/2013  ? Procedure: EXCISION OSTEOCHONDROMA RIGHT SHOULDER AND RIGHT WRIST;  Surgeon: Newt Minion, MD;  Location: [place];  Service: Orthopedics;  Laterality: Right;  ? OSTEOCHONDROMA EXCISION Bilateral 07/11/2015  ? Procedure: OSTEOCHONDROMA EXCISION BILATERAL MEDIAL TIBIAL PLATEAU;  Surgeon: Newt Minion, MD;  Location: [place];  Service: Orthopedics;  Laterality: Bilateral;  ? OSTEOCHONDROMA EXCISION Left 04/01/2017  ? Procedure: OSTEOCHONDROMA EXCISION x2 LEFT WRIST;  Surgeon: Newt Minion, MD;  Location: [place];  Service: Orthopedics;  Laterality: Left;  ? TYMPANOSTOMY TUBE PLACEMENT    ? ? ?Family History  ?Problem Relation Age of Onset  ? Diabetes Other   ? Hyperlipidemia Father   ? Diabetes Maternal Grandfather   ? Hyperlipidemia Maternal Grandfather   ? Hypertension Maternal Grandfather   ? Arthritis Maternal Grandfather   ? Alcohol abuse Paternal Grandmother   ? Arthritis Paternal Grandmother   ? Arthritis Mother   ? Scoliosis Mother   ? Asthma Mother   ? Depression Mother   ?  Arthritis Maternal Grandmother   ? Arthritis Paternal Grandfather   ? Diabetes Other   ? Stroke Sister   ? Kidney disease Other   ? Diabetes Other   ? ? ?Social History  ? ?Socioeconomic History  ? Marital status: Single  ?  Spouse name: Not on file  ? Number of children: Not on file  ? Years of education: Not on file  ? Highest education level: Not on file  ?Occupational History  ? Not on file  ?Tobacco Use  ? Smoking status: Never  ? Smokeless tobacco: Never  ? Tobacco comments:  ?  No smokers in home  ?Substance and Sexual Activity  ? Alcohol use: No  ? Drug use: No  ? Sexual activity: Never   ?Other Topics Concern  ? Not on file  ?Social History Narrative  ? Not on file  ? ?Social Determinants of Health  ? ?Financial Resource Strain: Not on file  ?Food Insecurity: Not on file  ?Transportation Needs: Not on file  ?Physical Activity: Not on file  ?Stress: Not on file  ?Social Connections: Not on file  ?Intimate Partner Violence: Not on file  ? ? ?Objective:  ? ?Today's Vitals: BP 128/72   Pulse 86   Temp 98.7 ?F (37.1 ?C)   Ht '5\' 9"'$  (1.753 m)   Wt 231 lb 12.8 oz (105.1 kg)   SpO2 98%   BMI 34.23 kg/m?  ? ?Physical Exam ? ?21 year old male in no acute distress ?Cardiovascular exam with regular rate and rhythm ?Lungs clear to auscultation bilaterally ? ?Assessment & Plan:  ? ?Problem List Items Addressed This Visit   ? ?  ? Other  ? On pre-exposure prophylaxis for HIV  ?  Check labs today for monitoring ?  ?  ? Depression  ?  Currently symptomatic with PHQ-9 score of 14.  Will resume sertraline, start with lower dose of 50 mg with plan to titrate over the next 2 to 4 weeks to 100 mg as long as tolerating the medication ?Plan for close follow-up in 2 weeks to monitor response to medication assess for any SI/HI.  Did discuss potential for worsening SI/HI with initiation of SSRI.  Discussed that if this does happen to contact the office or present to local emergency department, patient aware and in agreement ?  ?  ? Relevant Medications  ? sertraline (ZOLOFT) 50 MG tablet  ? Attention deficit hyperactivity disorder (ADHD), combined type - Primary  ?  Symptoms controlled in the past with Concerta, however at higher dose it seems that he was having some adverse effects ?We will resume Concerta but at lower dose of 36 mg, prescription sent to pharmacy on file ?Reviewed PDMP, no red flags today ?  ?  ? Relevant Medications  ? methylphenidate (CONCERTA) 36 MG PO CR tablet  ? High risk sexual behavior  ?  Continue with discovery, new prescription sent, will check labs today for monitoring ?  ?  ? Relevant  Medications  ? emtricitabine-tenofovir AF (DESCOVY) 200-25 MG tablet  ? Other Relevant Orders  ? HIV antibody (with reflex)  ? Hepatitis C Antibody  ? Hepatitis B surface antigen  ? Hepatitis B Surface AntiBODY  ? ?Other Visit Diagnoses   ? ? High risk medication use      ? Relevant Orders  ? Basic Metabolic Panel (BMET)  ? ?  ? ? ?Outpatient Encounter Medications as of 11/15/2021  ?Medication Sig  ? albuterol (VENTOLIN HFA) 108 (90 Base) MCG/ACT  inhaler Inhale 2 puffs into the lungs every 6 (six) hours as needed for wheezing or shortness of breath. For shortness of breath  ? Beclomethasone Dipropionate 80 MCG/ACT AERS Place 2 sprays into the nose daily.  ? emtricitabine-tenofovir AF (DESCOVY) 200-25 MG tablet Take 1 tablet by mouth daily.  ? levocetirizine (XYZAL) 5 MG tablet Take 1 tablet (5 mg total) by mouth every evening.  ? meclizine (ANTIVERT) 12.5 MG tablet Take 1 tablet (12.5 mg total) by mouth 3 (three) times daily as needed for dizziness.  ? methylphenidate (CONCERTA) 36 MG PO CR tablet Take 1 tablet (36 mg total) by mouth daily.  ? sertraline (ZOLOFT) 50 MG tablet Take 1 tablet (50 mg total) by mouth daily.  ? [DISCONTINUED] levocetirizine (XYZAL) 5 MG tablet take one tablet by mouth daily every evening  ? [DISCONTINUED] CONCERTA 54 MG CR tablet TAKE 1 TABLET BY MOUTH EVERY MORNING. (Patient not taking: Reported on 11/15/2021)  ? [DISCONTINUED] methylphenidate 54 MG PO CR tablet TAKE 1 TABLET BY MOUTH EVERY MORNING.  ? [DISCONTINUED] methylphenidate 54 MG PO CR tablet TAKE 1 TABLET BY MOUTH EVERY MORNING.  ? [DISCONTINUED] methylphenidate 54 MG PO CR tablet TAKE 1 TABLET BY MOUTH EVERY MORNING  ? [DISCONTINUED] methylphenidate 54 MG PO CR tablet TAKE 1 TABLET BY MOUTH EVERY MORNING. (Patient not taking: Reported on 11/15/2021)  ? [DISCONTINUED] sertraline (ZOLOFT) 100 MG tablet Take 1 tablet (100 mg total) by mouth daily. (Patient not taking: Reported on 11/15/2021)  ? ?No facility-administered encounter  medications on file as of 11/15/2021.  ? ? ?Follow-up: Return in about 2 weeks (around 11/29/2021).  Plan for follow-up in 2 weeks to monitor therapy with sertraline, can be virtual ? ?Leopold Smyers J [place], MD ? ?

## 2021-11-16 LAB — HIV ANTIBODY (ROUTINE TESTING W REFLEX): HIV Screen 4th Generation wRfx: NONREACTIVE

## 2021-11-16 LAB — BASIC METABOLIC PANEL
BUN/Creatinine Ratio: 12 (ref 9–20)
BUN: 10 mg/dL (ref 6–20)
CO2: 20 mmol/L (ref 20–29)
Calcium: 9.7 mg/dL (ref 8.7–10.2)
Chloride: 106 mmol/L (ref 96–106)
Creatinine, Ser: 0.85 mg/dL (ref 0.76–1.27)
Glucose: 99 mg/dL (ref 70–99)
Potassium: 5.1 mmol/L (ref 3.5–5.2)
Sodium: 141 mmol/L (ref 134–144)
eGFR: 128 mL/min/{1.73_m2} (ref >59)

## 2021-11-16 LAB — HEPATITIS B SURFACE ANTIGEN: Hepatitis B Surface Ag: NEGATIVE

## 2021-11-16 LAB — HEPATITIS B SURFACE ANTIBODY,QUALITATIVE: Hep B Surface Ab, Qual: NONREACTIVE

## 2021-11-16 LAB — HEPATITIS C ANTIBODY: Hep C Virus Ab: NONREACTIVE

## 2021-11-25 ENCOUNTER — Ambulatory Visit (INDEPENDENT_AMBULATORY_CARE_PROVIDER_SITE_OTHER): Payer: 59 | Admitting: Psychology

## 2021-11-25 DIAGNOSIS — F4323 Adjustment disorder with mixed anxiety and depressed mood: Secondary | ICD-10-CM | POA: Diagnosis not present

## 2021-11-25 NOTE — Progress Notes (Signed)
[facility] Counselor Initial Adult Exam ? ?Name: Joshua Yang ?Date: 11/25/2021 ?MRN: [id number] ?DOB: May 30, 2001 ?PCP: de [place], [name], MD ? ?Time spent: 4:00pm - 4:55pm    55 minutes ? ?Guardian/Payee:  n/a   ? ?Paperwork requested: No  ? ?Reason for Visit /Presenting Problem:  ?Pt present for face-to-face initial assessment update via video Webex.  Pt consents to telehealth video session due to COVID 19 pandemic. ?Location of pt: home ?Location of therapist: home office.  ?Pt continues to need support as he navigates his current life stage of being in college.   Pt tends to worry about things that are not in his control.  ?Reviewed pt's treatment plan for annual update.  Pt participated in setting treatment goals.  See Treatment plan below.   ?Plan to continue to meet monthly.   ? ?Mental Status Exam: ?Appearance:   Casual     ?Behavior:  Appropriate  ?Motor:  Normal  ?Speech/Language:   Normal Rate  ?Affect:  Appropriate  ?Mood:  normal  ?Thought process:  normal  ?Thought content:    WNL  ?Sensory/Perceptual disturbances:    WNL  ?Orientation:  oriented to person, place, time/date, and situation  ?Attention:  Good  ?Concentration:  Good  ?Memory:  WNL  ?Fund of knowledge:   Good  ?Insight:    Good  ?Judgment:   Good  ?Impulse Control:  Good  ? ?Reported Symptoms:  stress ? ?Risk Assessment: ?Danger to Self:  No ?Self-injurious Behavior: No ?Danger to Others: No ?Duty to Warn:no ?Physical Aggression / Violence:No  ?Access to Firearms a concern: No  ?Gang Involvement:No  ?Patient / guardian was educated about steps to take if suicide or homicide risk level increases between visits: n/a ?While future psychiatric events cannot be accurately predicted, the patient does not currently require acute inpatient psychiatric care and does not currently meet [facility] involuntary commitment criteria. ? ?Substance Abuse History: ?Current substance abuse: No    ? ?Past Psychiatric History:    ?Previous psychological history is significant for anxiety and depression ?Outpatient Providers:pt has been in therapy for several years. ?History of Psych Hospitalization: No  ?Psychological Testing:  n/a   ? ?Abuse History:  ?Victim of: No.,  n/a    ?Report needed: No. ?Victim of Neglect:No. ?Perpetrator of  n/a   ?Witness / Exposure to Domestic Violence: No   ?Protective Services Involvement: No  ?Witness to [employer] Violence:  No  ? ?Family History:  ?Family History  ?Problem Relation Age of Onset  ? Diabetes Other   ? Hyperlipidemia Father   ? Diabetes Maternal Grandfather   ? Hyperlipidemia Maternal Grandfather   ? Hypertension Maternal Grandfather   ? Arthritis Maternal Grandfather   ? Alcohol abuse Paternal Grandmother   ? Arthritis Paternal Grandmother   ? Arthritis Mother   ? Scoliosis Mother   ? Asthma Mother   ? Depression Mother   ? Arthritis Maternal Grandmother   ? Arthritis Paternal Grandfather   ? Diabetes Other   ? Stroke Sister   ? Kidney disease Other   ? Diabetes Other   ? ? ?Living situation: the patient lives with their family. ? ?Pt's parents have been divorced since 2010 and pt shared time with each parent. Parents have joint custody.  ?There is a family history of depression in father.  Pt's mother has anxiety.  ? ?Sexual Orientation: Gay ? ?Relationship Status: single  ?Name of spouse / other:n/a ?If a parent, number of children /  ages:none ? ?Support Systems: friends ?parents ? ?Financial Stress:  No  ? ?Income/Employment/Disability: Employment ? ?Military Service: No  ? ?Educational History: ?Education: some college ? ?Religion/Sprituality/World View: ?Protestant ? ?Any cultural differences that may affect / interfere with treatment:  not applicable  ? ?Recreation/Hobbies: computer games ? ?Stressors: Marital or family conflict   ?Occupational concerns   ? ?Strengths: Family, Friends, Conservator, museum/gallery, and Able to Communicate Effectively ? ?Barriers:  none  ? ?Legal History: ?Pending  legal issue / charges: The patient has no significant history of legal issues. ?History of legal issue / charges:  n/a ? ?Medical History/Surgical History: reviewed ?Past Medical History:  ?Diagnosis Date  ? Allergy   ? seasonal  ? Anxiety   ?  no medications  ? Asthma   ? allergy induced asthma  ? Cellulitis   ? hx of  ? Family history of adverse reaction to anesthesia   ? mother had historu of running a high fever after surgery, stated it is not malignant hyperthermia  ? Hearing loss, conductive, bilateral   ? Impetigo   ? hx of  ? Multiple hereditary osteochondromas   ? Staph skin infection   ? Vision abnormalities   ? wears glasses  ? ? ?Past Surgical History:  ?Procedure Laterality Date  ? EAR TUBE REMOVAL    ? OSTEOCHONDROMA EXCISION    ? x2 one on heel and one on upper thigh  ? OSTEOCHONDROMA EXCISION  03/10/2012  ? Procedure: OSTEOCHONDROMA EXCISION;  Surgeon: Newt Minion, MD;  Location: [place];  Service: Orthopedics;  Laterality: Right;  Excision distal right tibia and fibula osteochondroma  ? OSTEOCHONDROMA EXCISION Right 03/30/2013  ? Procedure: EXCISION OSTEOCHONDROMA RIGHT SHOULDER AND RIGHT WRIST;  Surgeon: Newt Minion, MD;  Location: [place];  Service: Orthopedics;  Laterality: Right;  ? OSTEOCHONDROMA EXCISION Bilateral 07/11/2015  ? Procedure: OSTEOCHONDROMA EXCISION BILATERAL MEDIAL TIBIAL PLATEAU;  Surgeon: Newt Minion, MD;  Location: [place];  Service: Orthopedics;  Laterality: Bilateral;  ? OSTEOCHONDROMA EXCISION Left 04/01/2017  ? Procedure: OSTEOCHONDROMA EXCISION x2 LEFT WRIST;  Surgeon: Newt Minion, MD;  Location: [place];  Service: Orthopedics;  Laterality: Left;  ? TYMPANOSTOMY TUBE PLACEMENT    ? ? ?Medications: ?Current Outpatient Medications  ?Medication Sig Dispense Refill  ? albuterol (VENTOLIN HFA) 108 (90 Base) MCG/ACT inhaler Inhale 2 puffs into the lungs every 6 (six) hours as needed for wheezing or shortness of breath. For shortness of breath 18 g 1  ? Beclomethasone Dipropionate  80 MCG/ACT AERS Place 2 sprays into the nose daily. 10.6 g 11  ? emtricitabine-tenofovir AF (DESCOVY) 200-25 MG tablet Take 1 tablet by mouth daily. 30 tablet 2  ? levocetirizine (XYZAL) 5 MG tablet Take 1 tablet (5 mg total) by mouth every evening. 90 tablet 1  ? meclizine (ANTIVERT) 12.5 MG tablet Take 1 tablet (12.5 mg total) by mouth 3 (three) times daily as needed for dizziness. 30 tablet 0  ? methylphenidate (CONCERTA) 36 MG PO CR tablet Take 1 tablet (36 mg total) by mouth daily. 30 tablet 0  ? sertraline (ZOLOFT) 50 MG tablet Take 1 tablet (50 mg total) by mouth daily. 30 tablet 1  ? ?No current facility-administered medications for this visit.  ? ? ?Allergies  ?Allergen Reactions  ? Latex Other (See Comments)  ?  Skin irritation  ? Amoxicillin-Pot Clavulanate Diarrhea and Nausea And Vomiting  ?  Severe ?  ? Tape Rash  ?  Plastic Tape  ? ? ?  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 ?Plan of Care: Treatment Plan ?Client Abilities/Strengths  ?Pt is bright, engaging, and motivated for therapy.  ?Client Treatment Preferences  ?Individual therapy.  ?Client Statement of Needs  ?Improve copings skills and understand herself better. Improve self esteem.  ?Symptoms  ?Depressed or irritable mood. Excessive and/or unrealistic worry that is difficult to control occurring more days than not for at least 6 months about a number of events or activities. Hypervigilance (e.g., feeling constantly on edge, experiencing concentration difficulties, having trouble falling or staying asleep, exhibiting a general state of irritability). Low self-esteem. ?Problems Addressed  ?Unipolar Depression, Anxiety ?Goals ?1. Alleviate depressive symptoms and return to previous level of effective functioning. ?2. Appropriately grieve the loss in order to normalize mood and to return to previously adaptive level of functioning. ?Objective ?Learn and implement behavioral strategies to overcome depression. ?Target Date: 2022-11-26 Frequency: Monthly  ?Progress: 50  Modality: individual  ?Related Interventions ?Assist the client in developing skills that increase the likelihood of deriving pleasure from behavioral activation (e.g., assertiveness skills, developing an ex

## 2021-11-28 ENCOUNTER — Other Ambulatory Visit: Payer: Self-pay

## 2021-11-28 ENCOUNTER — Telehealth (HOSPITAL_BASED_OUTPATIENT_CLINIC_OR_DEPARTMENT_OTHER): Payer: 59 | Admitting: Family Medicine

## 2021-11-28 ENCOUNTER — Ambulatory Visit (INDEPENDENT_AMBULATORY_CARE_PROVIDER_SITE_OTHER): Payer: 59 | Admitting: Family Medicine

## 2021-11-28 ENCOUNTER — Ambulatory Visit (HOSPITAL_BASED_OUTPATIENT_CLINIC_OR_DEPARTMENT_OTHER): Payer: 59 | Admitting: Family Medicine

## 2021-11-28 DIAGNOSIS — F902 Attention-deficit hyperactivity disorder, combined type: Secondary | ICD-10-CM | POA: Diagnosis not present

## 2021-11-28 DIAGNOSIS — F32A Depression, unspecified: Secondary | ICD-10-CM | POA: Diagnosis not present

## 2021-11-28 MED ORDER — SERTRALINE HCL 100 MG PO TABS
100.0000 mg | ORAL_TABLET | Freq: Every day | ORAL | 1 refills | Status: DC
  Filled 2021-11-28 – 2021-12-16 (×2): qty 30, 30d supply, fill #0

## 2021-11-28 NOTE — Assessment & Plan Note (Signed)
Overall, patient reports that he has been doing well with sertraline, did have some slight GI upset on initiating medication, but this has resolved.  He denies any SI or HI.  Continues to work with Social worker.  Slight improvement, however no significant changes yet in regards to symptoms. ?Discussed options, patient would like to continue with 50 mg dose until current month supply is completed and then transition to 100 mg dose.  Feel that this is reasonable, will send prescription for 100 mg tablet to be started once patient finishes his current month of 50 mg dose ?We will plan to assess progress at next visit in about 1 month ?

## 2021-11-28 NOTE — Progress Notes (Signed)
? ?  Virtual Visit via Telephone ?  ?I connected with  Joshua Yang  on 11/28/21 by telephone/telehealth and verified that I am speaking with the correct person using two identifiers. ?  ?I discussed the limitations, risks, security and privacy concerns of performing an evaluation and management service by telephone, including the higher likelihood of inaccurate diagnosis and treatment, and the availability of in person appointments.  We also discussed the likely need of an additional face to face encounter for complete and high quality delivery of care.  I also discussed with the patient that there may be a patient responsible charge related to this service. The patient expressed understanding and wishes to proceed. ? ?Provider location is in medical facility. ?Patient location is at their home, different from provider location. ?People involved in care of the patient during this telehealth encounter were myself, my nurse/medical assistant, and my front office/scheduling team member. ? ?Review of Systems: No fevers, chills, night sweats, weight loss, chest pain, or shortness of breath.  ? ?Objective Findings:   ? ?General: Speaking full sentences, no audible heavy breathing.  Sounds alert and appropriately interactive.   ? ?Independent interpretation of tests performed by another provider:  ? ?None. ? ?Brief History, Exam, Impression, and Recommendations:   ? ?Depression ?Overall, patient reports that he has been doing well with sertraline, did have some slight GI upset on initiating medication, but this has resolved.  He denies any SI or HI.  Continues to work with Social worker.  Slight improvement, however no significant changes yet in regards to symptoms. ?Discussed options, patient would like to continue with 50 mg dose until current month supply is completed and then transition to 100 mg dose.  Feel that this is reasonable, will send prescription for 100 mg tablet to be started once patient finishes his  current month of 50 mg dose ?We will plan to assess progress at next visit in about 1 month ? ?Attention deficit hyperactivity disorder (ADHD), combined type ?At last visit, patient was a started back on Concerta which he had used in the past.  Previously he had been on a higher dose and so we started patient on 36 mg dose.  He reports that this has been helpful in controlling symptoms and he is not noticing adverse effects like he had with the 54 mg dose in the past.  He denies any issues with heart racing, palpitations, chest pain, appetite concerns, sleep issues. ?At this time we will continue with 36 mg dose and monitor progress.  He will request refill as the time gets closer for it ? ?I discussed the above assessment and treatment plan with the patient. The patient was provided an opportunity to ask questions and all were answered. The patient agreed with the plan and demonstrated an understanding of the instructions. ?  ?The patient was advised to call back or seek an in-person evaluation if the symptoms worsen or if the condition fails to improve as anticipated. ?  ?I provided 10 minutes of face to face and non-face-to-face time during this encounter date, time was needed to gather information, review chart, records, communicate/coordinate with staff remotely, as well as complete documentation. ? ? ?___________________________________________ ?Donyel Castagnola [place], MD, ABFM, CAQSM ?Primary Care and Sports Medicine ?[place] ?

## 2021-11-28 NOTE — Assessment & Plan Note (Signed)
At last visit, patient was a started back on Concerta which he had used in the past.  Previously he had been on a higher dose and so we started patient on 36 mg dose.  He reports that this has been helpful in controlling symptoms and he is not noticing adverse effects like he had with the 54 mg dose in the past.  He denies any issues with heart racing, palpitations, chest pain, appetite concerns, sleep issues. ?At this time we will continue with 36 mg dose and monitor progress.  He will request refill as the time gets closer for it ?

## 2021-11-29 ENCOUNTER — Telehealth (HOSPITAL_BASED_OUTPATIENT_CLINIC_OR_DEPARTMENT_OTHER): Payer: 59 | Admitting: Family Medicine

## 2021-11-29 ENCOUNTER — Other Ambulatory Visit: Payer: Self-pay

## 2021-11-29 ENCOUNTER — Emergency Department (HOSPITAL_BASED_OUTPATIENT_CLINIC_OR_DEPARTMENT_OTHER): Payer: 59

## 2021-11-29 ENCOUNTER — Emergency Department (HOSPITAL_BASED_OUTPATIENT_CLINIC_OR_DEPARTMENT_OTHER)
Admission: EM | Admit: 2021-11-29 | Discharge: 2021-11-29 | Disposition: A | Discharge status: Home / Self Care | Payer: 59 | Attending: Emergency Medicine | Admitting: Emergency Medicine

## 2021-11-29 ENCOUNTER — Emergency Department (HOSPITAL_BASED_OUTPATIENT_CLINIC_OR_DEPARTMENT_OTHER): Payer: 59 | Admitting: Radiology

## 2021-11-29 ENCOUNTER — Encounter (HOSPITAL_BASED_OUTPATIENT_CLINIC_OR_DEPARTMENT_OTHER): Payer: Self-pay | Admitting: Emergency Medicine

## 2021-11-29 DIAGNOSIS — S0031XA Abrasion of nose, initial encounter: Secondary | ICD-10-CM | POA: Diagnosis not present

## 2021-11-29 DIAGNOSIS — R079 Chest pain, unspecified: Secondary | ICD-10-CM | POA: Diagnosis not present

## 2021-11-29 DIAGNOSIS — Z91040 Latex allergy status: Secondary | ICD-10-CM | POA: Insufficient documentation

## 2021-11-29 DIAGNOSIS — Z23 Encounter for immunization: Secondary | ICD-10-CM | POA: Insufficient documentation

## 2021-11-29 DIAGNOSIS — S0592XA Unspecified injury of left eye and orbit, initial encounter: Secondary | ICD-10-CM | POA: Diagnosis not present

## 2021-11-29 DIAGNOSIS — R9431 Abnormal electrocardiogram [ECG] [EKG]: Secondary | ICD-10-CM | POA: Diagnosis not present

## 2021-11-29 DIAGNOSIS — Y92410 Unspecified street and highway as the place of occurrence of the external cause: Secondary | ICD-10-CM | POA: Diagnosis not present

## 2021-11-29 DIAGNOSIS — S0091XA Abrasion of unspecified part of head, initial encounter: Secondary | ICD-10-CM | POA: Diagnosis not present

## 2021-11-29 DIAGNOSIS — R519 Headache, unspecified: Secondary | ICD-10-CM | POA: Diagnosis not present

## 2021-11-29 DIAGNOSIS — S3992XA Unspecified injury of lower back, initial encounter: Secondary | ICD-10-CM | POA: Diagnosis not present

## 2021-11-29 DIAGNOSIS — M545 Low back pain, unspecified: Secondary | ICD-10-CM | POA: Diagnosis not present

## 2021-11-29 DIAGNOSIS — R0789 Other chest pain: Secondary | ICD-10-CM | POA: Diagnosis not present

## 2021-11-29 MED ORDER — TETANUS-DIPHTH-ACELL PERTUSSIS 5-2.5-18.5 LF-MCG/0.5 IM SUSY
0.5000 mL | PREFILLED_SYRINGE | Freq: Once | INTRAMUSCULAR | Status: AC
  Administered 2021-11-29: 0.5 mL via INTRAMUSCULAR
  Filled 2021-11-29: qty 0.5

## 2021-11-29 MED ORDER — LIDOCAINE 4 % EX PTCH: 1.0000 | MEDICATED_PATCH | Freq: Two times a day (BID) | CUTANEOUS | 0 refills | Status: DC | PRN

## 2021-11-29 MED ORDER — ACETAMINOPHEN 500 MG PO TABS
1000.0000 mg | ORAL_TABLET | Freq: Once | ORAL | Status: AC
Start: 2021-11-29 — End: 2021-11-29
  Administered 2021-11-29: 1000 mg via ORAL
  Filled 2021-11-29: qty 2

## 2021-11-29 MED ORDER — LIDOCAINE 5 % EX PTCH
1.0000 | MEDICATED_PATCH | CUTANEOUS | Status: DC
  Administered 2021-11-29: 1 via TRANSDERMAL
  Filled 2021-11-29: qty 1

## 2021-11-29 NOTE — ED Notes (Signed)
Patient transported to CT 
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
## 2021-12-12 ENCOUNTER — Other Ambulatory Visit: Payer: Self-pay

## 2021-12-16 ENCOUNTER — Other Ambulatory Visit: Payer: Self-pay

## 2021-12-16 ENCOUNTER — Other Ambulatory Visit (HOSPITAL_COMMUNITY): Payer: Self-pay

## 2021-12-23 ENCOUNTER — Ambulatory Visit: Payer: 59 | Admitting: Psychology

## 2021-12-30 ENCOUNTER — Ambulatory Visit (HOSPITAL_BASED_OUTPATIENT_CLINIC_OR_DEPARTMENT_OTHER): Payer: 59 | Admitting: Family Medicine

## 2022-01-02 ENCOUNTER — Other Ambulatory Visit (HOSPITAL_COMMUNITY): Payer: Self-pay

## 2022-01-02 ENCOUNTER — Ambulatory Visit (HOSPITAL_BASED_OUTPATIENT_CLINIC_OR_DEPARTMENT_OTHER): Payer: 59 | Admitting: Family Medicine

## 2022-01-02 DIAGNOSIS — F902 Attention-deficit hyperactivity disorder, combined type: Secondary | ICD-10-CM | POA: Diagnosis not present

## 2022-01-02 DIAGNOSIS — F32A Depression, unspecified: Secondary | ICD-10-CM | POA: Diagnosis not present

## 2022-01-02 DIAGNOSIS — Z7251 High risk heterosexual behavior: Secondary | ICD-10-CM | POA: Diagnosis not present

## 2022-01-02 DIAGNOSIS — Z0000 Encounter for general adult medical examination without abnormal findings: Secondary | ICD-10-CM

## 2022-01-02 MED ORDER — EMTRICITABINE-TENOFOVIR AF 200-25 MG PO TABS
1.0000 | ORAL_TABLET | Freq: Every day | ORAL | 2 refills | Status: DC
  Filled 2022-01-02: qty 30, 30d supply, fill #0

## 2022-01-02 MED ORDER — METHYLPHENIDATE HCL ER (OSM) 36 MG PO TBCR
36.0000 mg | EXTENDED_RELEASE_TABLET | Freq: Every day | ORAL | 0 refills | Status: DC
  Filled 2022-01-02: qty 30, 30d supply, fill #0

## 2022-01-02 MED ORDER — SERTRALINE HCL 100 MG PO TABS
100.0000 mg | ORAL_TABLET | Freq: Every day | ORAL | 1 refills | Status: DC
  Filled 2022-01-02 – 2022-11-14 (×2): qty 90, 90d supply, fill #0

## 2022-01-02 NOTE — Assessment & Plan Note (Signed)
Patient continues to do well with Concerta 36 mg dose, denies any side effects today.  Has been out of the medication for a few days, did not request refill since he knew he would be in the office.  To discuss further and to request refill.  PDMP reviewed, no red flags, will proceed with refill today

## 2022-01-02 NOTE — Assessment & Plan Note (Signed)
Patient has been doing well with new dose of sertraline at 100 mg daily.  Denies any side effects of medication.  Feels that new dose is helping to control symptoms appropriately.  Would like to continue with 100 mg dose at this time which I feel is reasonable, refill sent to pharmacy on file

## 2022-01-02 NOTE — Progress Notes (Signed)
    Procedures performed today:    None.  Independent interpretation of notes and tests performed by another provider:   None.  Brief History, Exam, Impression, and Recommendations:    BP (!) 132/99   Pulse 79   Ht '5\' 9"'$  (1.753 m)   Wt 229 lb (103.9 kg)   SpO2 100%   BMI 33.82 kg/m   Depression Patient has been doing well with new dose of sertraline at 100 mg daily.  Denies any side effects of medication.  Feels that new dose is helping to control symptoms appropriately.  Would like to continue with 100 mg dose at this time which I feel is reasonable, refill sent to pharmacy on file  High risk sexual behavior Previously had prescription for disc OV sent to pharmacy on file, however he indicates that he was not informed by the pharmacy that the medication was ready and thus he has not been able to restart this.  Today, we sent a new prescription to the pharmacy and advised patient that if there is any difficulty with the prescription to let us know and we will see if an alternative medication would be needed for PrEP therapy.  If able to start then would need to complete monitoring labs while on therapy  Attention deficit hyperactivity disorder (ADHD), combined type Patient continues to do well with Concerta 36 mg dose, denies any side effects today.  Has been out of the medication for a few days, did not request refill since he knew he would be in the office.  To discuss further and to request refill.  PDMP reviewed, no red flags, will proceed with refill today  Plan follow-up in about 3 months for CPE or sooner as needed.  Will have patient complete blood work a few days before next appointment   ___________________________________________ Joshua Yang de [place], MD, ABFM, CAQ[facility] [place]

## 2022-01-02 NOTE — Assessment & Plan Note (Addendum)
Previously had prescription for disc OV sent to pharmacy on file, however he indicates that he was not informed by the pharmacy that the medication was ready and thus he has not been able to restart this.  Today, we sent a new prescription to the pharmacy and advised patient that if there is any difficulty with the prescription to let us know and we will see if an alternative medication would be needed for PrEP therapy.  If able to start then would need to complete monitoring labs while on therapy

## 2022-01-21 ENCOUNTER — Ambulatory Visit (INDEPENDENT_AMBULATORY_CARE_PROVIDER_SITE_OTHER): Payer: 59 | Admitting: Psychology

## 2022-01-21 DIAGNOSIS — F4323 Adjustment disorder with mixed anxiety and depressed mood: Secondary | ICD-10-CM
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
## 2022-02-20 ENCOUNTER — Ambulatory Visit (INDEPENDENT_AMBULATORY_CARE_PROVIDER_SITE_OTHER): Payer: 59 | Admitting: Psychology

## 2022-02-20 DIAGNOSIS — F4323 Adjustment disorder with mixed anxiety and depressed mood: Secondary | ICD-10-CM | POA: Diagnosis not present

## 2022-02-20 NOTE — Progress Notes (Signed)
[facility] Counselor/Therapist Progress Note  Patient ID: Raeqwon Lux, MRN: [id number],    Date: 02/20/2022  Time Spent: 3:00pm-3:50pm   50 minutes   Treatment Type: Individual Therapy  Reported Symptoms: stress  Mental Status Exam: Appearance:  Casual     Behavior: Appropriate  Motor: Normal  Speech/Language:  Normal Rate  Affect: Appropriate  Mood: normal  Thought process: normal  Thought content:   WNL  Sensory/Perceptual disturbances:   WNL  Orientation: oriented to person, place, time/date, and situation  Attention: Good  Concentration: Good  Memory: WNL  Fund of knowledge:  Good  Insight:   Good  Judgment:  Good  Impulse Control: Good   Risk Assessment: Danger to Self:  No Self-injurious Behavior: No Danger to Others: No Duty to Warn:no Physical Aggression / Violence:No  Access to Firearms a concern: No  Gang Involvement:No   Subjective: Pt present for face-to-face individual therapy via video Webex.  Pt consents to telehealth video session due to COVID 19 pandemic. Location of pt: home Location of therapist: home office.  Pt talked about feeling annoyed that his father and stepmother expect him to take care of the puppies at times.  Pt had to clean up a mess and was frustrated but did not feel he could tell his father bc his father can tend to get easily angered.   Helped  process his feelings and relationship dynamics.   Pt talked about work.   He still has not heard back about his application to [employer] and pt feels like things at [employer] are getting worse.  Addressed the issues at [employer] [facility] and how they are affecting him.   Pt has let his job performance be affected by his dissatisfaction with the new leadership.   Worked with pt on how that behavior does not serve him well.  Addressed how he can make performance decisions based on intrinsic values rather than external factors.   Provided supportive therapy.    Interventions: Cognitive  Behavioral Therapy and Insight-Oriented  Diagnosis: F43.23  Plan:  Plan of Care: Treatment Plan  (Treatment Plan Target Date: 11/26/2022) Client Abilities/Strengths  Pt is bright, engaging, and motivated for therapy.  Client Treatment Preferences  Individual therapy.  Client Statement of Needs  Improve copings skills. Symptoms  Depressed or irritable mood. Excessive and/or unrealistic worry that is difficult to control occurring more days than not for at least 6 months about a number of events or activities. Hypervigilance (e.g., feeling constantly on edge, experiencing concentration difficulties, having trouble falling or staying asleep, exhibiting a general state of irritability). Low self esteem. Problems Addressed  Unipolar Depression, Anxiety Goals 1. Alleviate depressive symptoms and return to previous level of effective functioning. 2. Appropriately grieve the loss in order to normalize mood and to return to previously adaptive level of functioning. Objective Learn and implement behavioral strategies to overcome depression. Target Date: 2022-11-26 Frequency: Monthly  Progress: 50 Modality: individual  Related Interventions Assist the client in developing skills that increase the likelihood of deriving pleasure from behavioral activation (e.g., assertiveness skills, developing an exercise plan, less internal/more external focus, increased social involvement); reinforce success. Engage the client in "behavioral activation," increasing his/her activity level and contact with sources of reward, while identifying processes that inhibit activation. use behavioral techniques such as instruction, rehearsal, role-playing, role reversal, as needed, to facilitate activity in the client's daily life; reinforce success. 3. Develop healthy interpersonal relationships that lead to the alleviation and help prevent the relapse of  depression. 4. Develop healthy thinking patterns and beliefs about  self, others, and the world that lead to the alleviation and help prevent the relapse of depression. 5. Enhance ability to effectively cope with the full variety of life's worries and anxieties. 6. Learn and implement coping skills that result in a reduction of anxiety and worry, and improved daily functioning. Objective Learn and implement problem-solving strategies for realistically addressing worries. Target Date: 2022-11-26 Frequency: Monthly  Progress: 50 Modality: individual  Related Interventions Assign the client a homework exercise in which he/she problem-solves a current problem (see Mastery of Your Anxiety and Worry: Workbook by Adora Fridge and Eliot Ford or Generalized Anxiety Disorder by Eather Colas, and Eliot Ford); review, reinforce success, and provide corrective feedback toward improvement. Teach the client problem-solving strategies involving specifically defining a problem, generating options for addressing it, evaluating the pros and cons of each option, selecting and implementing an optional action, and reevaluating and refining the action. Objective Learn and implement calming skills to reduce overall anxiety and manage anxiety symptoms. Target Date: 2022-11-26 Frequency: Monthly  Progress: 50 Modality: individual  Related Interventions Assign the client to read about progressive muscle relaxation and other calming strategies in relevant books or treatment manuals (e.g., Progressive Relaxation Training by Leroy Kennedy; Mastery of Your Anxiety and Worry: Workbook by Beckie Busing). Assign the client homework each session in which he/she practices relaxation exercises daily, gradually applying them progressively from non-anxiety-provoking to anxiety-provoking situations; review and reinforce success while providing corrective feedback toward improvement. Teach the client calming/relaxation skills (e.g., applied relaxation, progressive muscle relaxation, cue controlled  relaxation; mindful breathing; biofeedback) and how to discriminate better between relaxation and tension; teach the client how to apply these skills to his/her daily life. 7. Recognize, accept, and cope with feelings of depression. 8. Reduce overall frequency, intensity, and duration of the anxiety so that daily functioning is not impaired. 9. Resolve the core conflict that is the source of anxiety. 10. Stabilize anxiety level while increasing ability to function on a daily basis. Diagnosis F43.23   Conditions For Discharge Achievement of treatment goals and objectives   Clint Bolder, LCSW

## 2022-03-21 ENCOUNTER — Other Ambulatory Visit (HOSPITAL_COMMUNITY): Payer: Self-pay

## 2022-03-21 ENCOUNTER — Ambulatory Visit (HOSPITAL_BASED_OUTPATIENT_CLINIC_OR_DEPARTMENT_OTHER): Payer: 59 | Admitting: Family Medicine

## 2022-03-21 ENCOUNTER — Encounter (HOSPITAL_BASED_OUTPATIENT_CLINIC_OR_DEPARTMENT_OTHER): Payer: Self-pay | Admitting: Family Medicine

## 2022-03-21 DIAGNOSIS — J029 Acute pharyngitis, unspecified: Secondary | ICD-10-CM

## 2022-03-21 LAB — POCT RAPID STREP A (OFFICE): Rapid Strep A Screen: NEGATIVE

## 2022-03-21 NOTE — Patient Instructions (Signed)

## 2022-03-21 NOTE — Assessment & Plan Note (Signed)
Initially noticed about 2 days ago.  Feels that he has had some redness, possibly some increased swelling on the left side of throat.  Not aware of any sick contacts.  Has not had any fevers, chills, body aches.  Does report mild cough.  No significant headaches or sinus congestion.  Has had some pain with swallowing. On exam, patient is in no acute distress, vital signs stable, afebrile.  Heart with regular rate and rhythm, no murmur appreciated, lungs clear to auscultation bilaterally.  Pharynx with mild erythema, no exudate, no significant unilateral tonsillar swelling. Discussed most likely etiology of current symptoms, suspect viral source of present illness.  After discussion with patient, we did proceed with strep swab which was negative Recommend symptomatic measures at this time including OTC medications to help with pain control.  If having any sinus congestion, can utilize nasal saline spray and intranasal steroid spray.  Recommend ensuring adequate rest and hydration.  Discussed potential warning signs including worsening symptoms, worsening throat pain, unilateral throat swelling, hoarseness. We will plan for follow-up as needed

## 2022-03-21 NOTE — Progress Notes (Signed)
    Procedures performed today:    None.  Independent interpretation of notes and tests performed by another provider:   None.  Brief History, Exam, Impression, and Recommendations:    BP 127/75   Pulse 94   Temp 97.9 F (36.6 C) (Oral)   Ht '5\' 9"'$  (1.753 m)   Wt 225 lb (102.1 kg)   SpO2 100%   BMI 33.23 kg/m   Sore throat Initially noticed about 2 days ago.  Feels that he has had some redness, possibly some increased swelling on the left side of throat.  Not aware of any sick contacts.  Has not had any fevers, chills, body aches.  Does report mild cough.  No significant headaches or sinus congestion.  Has had some pain with swallowing. On exam, patient is in no acute distress, vital signs stable, afebrile.  Heart with regular rate and rhythm, no murmur appreciated, lungs clear to auscultation bilaterally.  Pharynx with mild erythema, no exudate, no significant unilateral tonsillar swelling. Discussed most likely etiology of current symptoms, suspect viral source of present illness.  After discussion with patient, we did proceed with strep swab which was negative Recommend symptomatic measures at this time including OTC medications to help with pain control.  If having any sinus congestion, can utilize nasal saline spray and intranasal steroid spray.  Recommend ensuring adequate rest and hydration.  Discussed potential warning signs including worsening symptoms, worsening throat pain, unilateral throat swelling, hoarseness. We will plan for follow-up as needed  Return if symptoms worsen or fail to improve.   ___________________________________________ Dalyce Renne de [place], MD, ABFM, CAQ[facility] [place]

## 2022-03-27 ENCOUNTER — Ambulatory Visit (HOSPITAL_BASED_OUTPATIENT_CLINIC_OR_DEPARTMENT_OTHER): Payer: 59

## 2022-03-27 DIAGNOSIS — Z0000 Encounter for general adult medical examination without abnormal findings: Secondary | ICD-10-CM | POA: Diagnosis not present

## 2022-03-27 DIAGNOSIS — Z7251 High risk heterosexual behavior: Secondary | ICD-10-CM | POA: Diagnosis not present

## 2022-03-28 LAB — CBC WITH DIFFERENTIAL/PLATELET
Basophils Absolute: 0 10*3/uL (ref 0.0–0.2)
Basos: 0 %
EOS (ABSOLUTE): 0.2 10*3/uL (ref 0.0–0.4)
Eos: 3 %
Hematocrit: 45.7 % (ref 37.5–51.0)
Hemoglobin: 15.4 g/dL (ref 13.0–17.7)
Immature Grans (Abs): 0 10*3/uL (ref 0.0–0.1)
Immature Granulocytes: 0 %
Lymphocytes Absolute: 2.8 10*3/uL (ref 0.7–3.1)
Lymphs: 38 %
MCH: 28.2 pg (ref 26.6–33.0)
MCHC: 33.7 g/dL (ref 31.5–35.7)
MCV: 84 fL (ref 79–97)
Monocytes Absolute: 0.5 10*3/uL (ref 0.1–0.9)
Monocytes: 6 %
Neutrophils Absolute: 4 10*3/uL (ref 1.4–7.0)
Neutrophils: 53 %
Platelets: 298 10*3/uL (ref 150–450)
RBC: 5.47 x10E6/uL (ref 4.14–5.80)
RDW: 11.9 % (ref 11.6–15.4)
WBC: 7.5 10*3/uL (ref 3.4–10.8)

## 2022-03-28 LAB — LIPID PANEL
Chol/HDL Ratio: 4.3 ratio (ref 0.0–5.0)
Cholesterol, Total: 162 mg/dL (ref 100–199)
HDL: 38 mg/dL — ABNORMAL LOW (ref >39)
LDL Chol Calc (NIH): 108 mg/dL — ABNORMAL HIGH (ref 0–99)
Triglycerides: 84 mg/dL (ref 0–149)
VLDL Cholesterol Cal: 16 mg/dL (ref 5–40)

## 2022-03-28 LAB — COMPREHENSIVE METABOLIC PANEL
ALT: 20 IU/L (ref 0–44)
AST: 15 IU/L (ref 0–40)
Albumin/Globulin Ratio: 1.9 (ref 1.2–2.2)
Albumin: 4.6 g/dL (ref 4.3–5.2)
Alkaline Phosphatase: 58 IU/L (ref 51–125)
BUN/Creatinine Ratio: 10 (ref 9–20)
BUN: 8 mg/dL (ref 6–20)
Bilirubin Total: 0.3 mg/dL (ref 0.0–1.2)
CO2: 24 mmol/L (ref 20–29)
Calcium: 9.3 mg/dL (ref 8.7–10.2)
Chloride: 101 mmol/L (ref 96–106)
Creatinine, Ser: 0.81 mg/dL (ref 0.76–1.27)
Globulin, Total: 2.4 g/dL (ref 1.5–4.5)
Glucose: 90 mg/dL (ref 70–99)
Potassium: 4.7 mmol/L (ref 3.5–5.2)
Sodium: 139 mmol/L (ref 134–144)
Total Protein: 7 g/dL (ref 6.0–8.5)
eGFR: 129 mL/min/{1.73_m2} (ref >59)

## 2022-03-28 LAB — HIV ANTIBODY (ROUTINE TESTING W REFLEX): HIV Screen 4th Generation wRfx: NONREACTIVE

## 2022-03-28 LAB — TSH RFX ON ABNORMAL TO FREE T4: TSH: 2.02 u[IU]/mL (ref 0.450–4.500)

## 2022-03-28 LAB — HEMOGLOBIN A1C
Est. average glucose Bld gHb Est-mCnc: 105 mg/dL
Hgb A1c MFr Bld: 5.3 % (ref 4.8–5.6)

## 2022-04-03 ENCOUNTER — Ambulatory Visit (INDEPENDENT_AMBULATORY_CARE_PROVIDER_SITE_OTHER): Payer: 59 | Admitting: Family Medicine

## 2022-04-03 ENCOUNTER — Ambulatory Visit (INDEPENDENT_AMBULATORY_CARE_PROVIDER_SITE_OTHER): Payer: 59 | Admitting: Psychology

## 2022-04-03 ENCOUNTER — Encounter (HOSPITAL_BASED_OUTPATIENT_CLINIC_OR_DEPARTMENT_OTHER): Payer: Self-pay | Admitting: Family Medicine

## 2022-04-03 ENCOUNTER — Other Ambulatory Visit (HOSPITAL_BASED_OUTPATIENT_CLINIC_OR_DEPARTMENT_OTHER): Payer: Self-pay

## 2022-04-03 VITALS — BP 126/74 | HR 88 | Temp 97.6°F | Ht 69.0 in | Wt 229.4 lb

## 2022-04-03 DIAGNOSIS — Z0000 Encounter for general adult medical examination without abnormal findings: Secondary | ICD-10-CM | POA: Diagnosis not present

## 2022-04-03 DIAGNOSIS — F4323 Adjustment disorder with mixed anxiety and depressed mood: Secondary | ICD-10-CM

## 2022-04-03 DIAGNOSIS — F902 Attention-deficit hyperactivity disorder, combined type: Secondary | ICD-10-CM

## 2022-04-03 DIAGNOSIS — Z23 Encounter for immunization: Secondary | ICD-10-CM

## 2022-04-03 MED ORDER — METHYLPHENIDATE HCL ER (OSM) 36 MG PO TBCR
36.0000 mg | EXTENDED_RELEASE_TABLET | Freq: Every day | ORAL | 0 refills | Status: DC
  Filled 2022-04-03: qty 30, 30d supply, fill #0

## 2022-04-03 NOTE — Assessment & Plan Note (Signed)
Patient continues to do well with Concerta 36 mg dose, denies any side effects today.  Has been out of the medication for a little.  Also reports that occasionally he will forget to take his medication from time to time.  Today, PDMP reviewed, no red flags, will proceed with refill today

## 2022-04-03 NOTE — Progress Notes (Signed)
[facility] Counselor/Therapist Progress Note  Patient ID: Joshua Yang, MRN: [id number],    Date: 04/03/2022  Time Spent: 10:00am-10:50am   50 minutes   Treatment Type: Individual Therapy  Reported Symptoms: stress  Mental Status Exam: Appearance:  Casual     Behavior: Appropriate  Motor: Normal  Speech/Language:  Normal Rate  Affect: Appropriate  Mood: normal  Thought process: normal  Thought content:   WNL  Sensory/Perceptual disturbances:   WNL  Orientation: oriented to person, place, time/date, and situation  Attention: Good  Concentration: Good  Memory: WNL  Fund of knowledge:  Good  Insight:   Good  Judgment:  Good  Impulse Control: Good   Risk Assessment: Danger to Self:  No Self-injurious Behavior: No Danger to Others: No Duty to Warn:no Physical Aggression / Violence:No  Access to Firearms a concern: No  Gang Involvement:No   Subjective: Pt present for face-to-face individual therapy via video Webex.  Pt consents to telehealth video session due to COVID 19 pandemic. Location of pt: home Location of therapist: home office.  Pt talked about his father's health.  Pt's father had a tumor removed and was doing well but now is back in the hospital bc of an infection.   There has been some stress in their relationship bc his father has been very critical of him.  Pt has been taking care of the puppies while his father was in the hospital.   It was frustrating for him bc Aldona Bar kept texting him to remind him to walk the dogs.   Helped pt process his feelings and relationship dynamics.   Pt talked about his half brother's plans to get married on Saturday but it was postponed bc his brother's brother was killed in a motorcycle accident on Friday.    Pt starts fall college classes online this Monday.  He is taking classes through [employer] and will continue to live at home.  Pt is looking forward to his college classes.   Pt states that emotionally  he has his ups and downs.  Worked with pt on identifying what triggers his down moments.   Provided supportive therapy.    Interventions: Cognitive Behavioral Therapy and Insight-Oriented  Diagnosis: F43.23  Plan:  Plan of Care: Treatment Plan  (Treatment Plan Target Date: 11/26/2022) Client Abilities/Strengths  Pt is bright, engaging, and motivated for therapy.  Client Treatment Preferences  Individual therapy.  Client Statement of Needs  Improve copings skills. Symptoms  Depressed or irritable mood. Excessive and/or unrealistic worry that is difficult to control occurring more days than not for at least 6 months about a number of events or activities. Hypervigilance (e.g., feeling constantly on edge, experiencing concentration difficulties, having trouble falling or staying asleep, exhibiting a general state of irritability). Low self esteem. Problems Addressed  Unipolar Depression, Anxiety Goals 1. Alleviate depressive symptoms and return to previous level of effective functioning. 2. Appropriately grieve the loss in order to normalize mood and to return to previously adaptive level of functioning. Objective Learn and implement behavioral strategies to overcome depression. Target Date: 2022-11-26 Frequency: Monthly  Progress: 50 Modality: individual  Related Interventions Assist the client in developing skills that increase the likelihood of deriving pleasure from behavioral activation (e.g., assertiveness skills, developing an exercise plan, less internal/more external focus, increased social involvement); reinforce success. Engage the client in "behavioral activation," increasing his/her activity level and contact with sources of reward, while identifying processes that inhibit activation. use behavioral techniques such as instruction, rehearsal, role-playing,  role reversal, as needed, to facilitate activity in the client's daily life; reinforce success. 3. Develop healthy  interpersonal relationships that lead to the alleviation and help prevent the relapse of depression. 4. Develop healthy thinking patterns and beliefs about self, others, and the world that lead to the alleviation and help prevent the relapse of depression. 5. Enhance ability to effectively cope with the full variety of life's worries and anxieties. 6. Learn and implement coping skills that result in a reduction of anxiety and worry, and improved daily functioning. Objective Learn and implement problem-solving strategies for realistically addressing worries. Target Date: 2022-11-26 Frequency: Monthly  Progress: 50 Modality: individual  Related Interventions Assign the client a homework exercise in which he/she problem-solves a current problem (see Mastery of Your Anxiety and Worry: Workbook by Adora Fridge and Eliot Ford or Generalized Anxiety Disorder by Eather Colas, and Eliot Ford); review, reinforce success, and provide corrective feedback toward improvement. Teach the client problem-solving strategies involving specifically defining a problem, generating options for addressing it, evaluating the pros and cons of each option, selecting and implementing an optional action, and reevaluating and refining the action. Objective Learn and implement calming skills to reduce overall anxiety and manage anxiety symptoms. Target Date: 2022-11-26 Frequency: Monthly  Progress: 50 Modality: individual  Related Interventions Assign the client to read about progressive muscle relaxation and other calming strategies in relevant books or treatment manuals (e.g., Progressive Relaxation Training by Leroy Kennedy; Mastery of Your Anxiety and Worry: Workbook by Beckie Busing). Assign the client homework each session in which he/she practices relaxation exercises daily, gradually applying them progressively from non-anxiety-provoking to anxiety-provoking situations; review and reinforce success while providing  corrective feedback toward improvement. Teach the client calming/relaxation skills (e.g., applied relaxation, progressive muscle relaxation, cue controlled relaxation; mindful breathing; biofeedback) and how to discriminate better between relaxation and tension; teach the client how to apply these skills to his/her daily life. 7. Recognize, accept, and cope with feelings of depression. 8. Reduce overall frequency, intensity, and duration of the anxiety so that daily functioning is not impaired. 9. Resolve the core conflict that is the source of anxiety. 10. Stabilize anxiety level while increasing ability to function on a daily basis. Diagnosis F43.23   Conditions For Discharge Achievement of treatment goals and objectives   Clint Bolder, LCSW

## 2022-04-03 NOTE — Addendum Note (Signed)
Addended by: Lowella Bandy on: 04/03/2022 03:41 PM   Modules accepted: Orders

## 2022-04-03 NOTE — Assessment & Plan Note (Signed)
Routine HCM labs reviewed. HCM reviewed/discussed. Anticipatory guidance regarding healthy weight, lifestyle and choices given. Recommend healthy diet.  Recommend approximately 150 minutes/week of moderate intensity exercise Recommend regular dental and vision exams Always use seatbelt/lap and shoulder restraints Recommend using smoke alarms and checking batteries at least twice a year Recommend using sunscreen when outside Discussed tetanus immunization recommendations, patient is UTD 

## 2022-04-03 NOTE — Patient Instructions (Signed)

## 2022-04-03 NOTE — Progress Notes (Signed)
Subjective:    CC: Annual Physical Exam  HPI:  Joshua Yang is a 21 y.o. presenting for annual physical  I reviewed the past medical history, family history, social history, surgical history, and allergies today and no changes were needed.  Please see the problem list section below in epic for further details.  Past Medical History: Past Medical History:  Diagnosis Date   Allergy    seasonal   Anxiety     no medications   Asthma    allergy induced asthma   Cellulitis    hx of   Family history of adverse reaction to anesthesia    mother had historu of running a high fever after surgery, stated it is not malignant hyperthermia   Hearing loss, conductive, bilateral    Impetigo    hx of   Multiple hereditary osteochondromas    Staph skin infection    Vision abnormalities    wears glasses   Past Surgical History: Past Surgical History:  Procedure Laterality Date   EAR TUBE REMOVAL     OSTEOCHONDROMA EXCISION     x2 one on heel and one on upper thigh   OSTEOCHONDROMA EXCISION  03/10/2012   Procedure: OSTEOCHONDROMA EXCISION;  Surgeon: Newt Minion, MD;  Location: [facility];  Service: Orthopedics;  Laterality: Right;  Excision distal right tibia and fibula osteochondroma   OSTEOCHONDROMA EXCISION Right 03/30/2013   Procedure: EXCISION OSTEOCHONDROMA RIGHT SHOULDER AND RIGHT WRIST;  Surgeon: Newt Minion, MD;  Location: [facility];  Service: Orthopedics;  Laterality: Right;   OSTEOCHONDROMA EXCISION Bilateral 07/11/2015   Procedure: OSTEOCHONDROMA EXCISION BILATERAL MEDIAL TIBIAL PLATEAU;  Surgeon: Newt Minion, MD;  Location: [facility];  Service: Orthopedics;  Laterality: Bilateral;   OSTEOCHONDROMA EXCISION Left 04/01/2017   Procedure: OSTEOCHONDROMA EXCISION x2 LEFT WRIST;  Surgeon: Newt Minion, MD;  Location: [facility];  Service: Orthopedics;  Laterality: Left;   TYMPANOSTOMY TUBE PLACEMENT     Social History: Social History   Socioeconomic History   Marital status: Single     Spouse name: Not on file   Number of children: Not on file   Years of education: Not on file   Highest education level: Not on file  Occupational History   Not on file  Tobacco Use   Smoking status: Never   Smokeless tobacco: Never   Tobacco comments:    No smokers in home  Substance and Sexual Activity   Alcohol use: No   Drug use: No   Sexual activity: Never  Other Topics Concern   Not on file  Social History Narrative   Not on file   Social Determinants of Health   Financial Resource Strain: Not on file  Food Insecurity: Not on file  Transportation Needs: Not on file  Physical Activity: Not on file  Stress: Not on file  Social Connections: Not on file   Family History: Family History  Problem Relation Age of Onset   Diabetes Other    Hyperlipidemia Father    Diabetes Maternal Grandfather    Hyperlipidemia Maternal Grandfather    Hypertension Maternal Grandfather    Arthritis Maternal Grandfather    Alcohol abuse Paternal Grandmother    Arthritis Paternal Grandmother    Arthritis Mother    Scoliosis Mother    Asthma Mother    Depression Mother    Arthritis Maternal Grandmother    Arthritis Paternal Grandfather    Diabetes Other    Stroke Sister    Kidney disease  Other    Diabetes Other    Allergies: Allergies  Allergen Reactions   Latex Other (See Comments)    Skin irritation   Amoxicillin-Pot Clavulanate Diarrhea and Nausea And Vomiting    Severe    Tape Rash    Plastic Tape   Medications: See med rec.  Review of Systems: No headache, visual changes, nausea, vomiting, diarrhea, constipation, dizziness, abdominal pain, skin rash, fevers, chills, night sweats, swollen lymph nodes, weight loss, chest pain, body aches, joint swelling, muscle aches, shortness of breath, mood changes, visual or auditory hallucinations.  Objective:    BP 126/74   Pulse 88   Temp 97.6 F (36.4 C) (Oral)   Ht '5\' 9"'$  (1.753 m)   Wt 229 lb 6.4 oz (104.1 kg)   SpO2  99%   BMI 33.88 kg/m   General: Well Developed, well nourished, and in no acute distress.  Neuro: Alert and oriented x3, extra-ocular muscles intact, sensation grossly intact. Cranial nerves II through XII are intact, motor, sensory, and coordinative functions are all intact. HEENT: Normocephalic, atraumatic, pupils equal round reactive to light, neck supple, no masses, no lymphadenopathy, thyroid nonpalpable. Oropharynx, nasopharynx, external ear canals are unremarkable. Skin: Warm and dry, no rashes noted.  Cardiac: Regular rate and rhythm, no murmurs rubs or gallops.  Respiratory: Clear to auscultation bilaterally. Not using accessory muscles, speaking in full sentences.  Abdominal: Soft, nontender, nondistended, positive bowel sounds, no masses, no organomegaly.  Musculoskeletal: Shoulder, elbow, wrist, hip, knee, ankle stable, and with full range of motion.  Impression and Recommendations:    Wellness examination Routine HCM labs reviewed. HCM reviewed/discussed. Anticipatory guidance regarding healthy weight, lifestyle and choices given. Recommend healthy diet.  Recommend approximately 150 minutes/week of moderate intensity exercise Recommend regular dental and vision exams Always use seatbelt/lap and shoulder restraints Recommend using smoke alarms and checking batteries at least twice a year Recommend using sunscreen when outside Discussed tetanus immunization recommendations, patient is UTD  Attention deficit hyperactivity disorder (ADHD), combined type Patient continues to do well with Concerta 36 mg dose, denies any side effects today.  Has been out of the medication for a little.  Also reports that occasionally he will forget to take his medication from time to time.  Today, PDMP reviewed, no red flags, will proceed with refill today  We will do HPV #1 today, he will get second dose at nurse visit 6-8 weeks from now. Third will be about 6 months from now  Return in about 3  months (around 07/04/2022) for Med check.   ___________________________________________ Joshua Tijerina de [place], MD, ABFM, CAQ[facility] [place]

## 2022-04-17 ENCOUNTER — Encounter (HOSPITAL_BASED_OUTPATIENT_CLINIC_OR_DEPARTMENT_OTHER): Payer: Self-pay | Admitting: Family Medicine

## 2022-04-17 DIAGNOSIS — J301 Allergic rhinitis due to pollen: Secondary | ICD-10-CM

## 2022-04-22 ENCOUNTER — Other Ambulatory Visit (HOSPITAL_COMMUNITY): Payer: Self-pay

## 2022-04-22 MED ORDER — LEVOCETIRIZINE DIHYDROCHLORIDE 5 MG PO TABS
5.0000 mg | ORAL_TABLET | Freq: Every evening | ORAL | 1 refills | Status: DC
  Filled 2022-04-22: qty 90, 90d supply, fill #0

## 2022-05-15 ENCOUNTER — Ambulatory Visit: Payer: 59 | Admitting: Psychology

## 2022-05-19 ENCOUNTER — Ambulatory Visit (INDEPENDENT_AMBULATORY_CARE_PROVIDER_SITE_OTHER): Payer: 59 | Admitting: Psychology

## 2022-05-19 DIAGNOSIS — F4323 Adjustment disorder with mixed anxiety and depressed mood: Secondary | ICD-10-CM | POA: Diagnosis not present

## 2022-05-19 NOTE — Progress Notes (Signed)
[facility] Counselor/Therapist Progress Note  Patient ID: Joshua Yang, MRN: [id number],    Date: 05/19/2022  Time Spent: 2:00pm - 2:50pm   50 minutes   Treatment Type: Individual Therapy  Reported Symptoms: stress  Mental Status Exam: Appearance:  Casual     Behavior: Appropriate  Motor: Normal  Speech/Language:  Normal Rate  Affect: Appropriate  Mood: normal  Thought process: normal  Thought content:   WNL  Sensory/Perceptual disturbances:   WNL  Orientation: oriented to person, place, time/date, and situation  Attention: Good  Concentration: Good  Memory: WNL  Fund of knowledge:  Good  Insight:   Good  Judgment:  Good  Impulse Control: Good   Risk Assessment: Danger to Self:  No Self-injurious Behavior: No Danger to Others: No Duty to Warn:no Physical Aggression / Violence:No  Access to Firearms a concern: No  Gang Involvement:No   Subjective: Pt present for face-to-face individual therapy via video Webex.  Pt consents to telehealth video session due to COVID 19 pandemic. Location of pt: home Location of therapist: home office.  Pt talked about his dog dying a couple of weeks ago.  Addressed the incident of the dog's death.  The dog was only 52 months old.  Helped pt process his feelings and grief.  Pt talked about his father offering to go to the pride festival with him.  This was a big step in pt's father accepting that pt is gay.  Pt was very touched and grateful to his father for going to the festival. Pt talked about work.  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   Addressed how pt is navigating that relationship.  Pt's manager told him that pt's association with the coworker reflects poorly on him.   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   Pt has been applying for new jobs.   He hopes to be able to leave [employer] as soon as  possible since he does not feel valued there.      Provided supportive therapy.    Interventions: Cognitive Behavioral Therapy and Insight-Oriented  Diagnosis: F43.23  Plan:  Plan of Care: Treatment Plan  (Treatment Plan Target Date: 11/26/2022) Client Abilities/Strengths  Pt is bright, engaging, and motivated for therapy.  Client Treatment Preferences  Individual therapy.  Client Statement of Needs  Improve copings skills. Symptoms  Depressed or irritable mood. Excessive and/or unrealistic worry that is difficult to control occurring more days than not for at least 6 months about a number of events or activities. Hypervigilance (e.g., feeling constantly on edge, experiencing concentration difficulties, having trouble falling or staying asleep, exhibiting a general state of irritability). Low self esteem. Problems Addressed  Unipolar Depression, Anxiety Goals 1. Alleviate depressive symptoms and return to previous level of effective functioning. 2. Appropriately grieve the loss in order to normalize mood and to return to previously adaptive level of functioning. Objective Learn and implement behavioral strategies to overcome depression. Target Date: 2022-11-26 Frequency: Monthly  Progress: 50 Modality: individual  Related Interventions Assist the client in developing skills that increase the likelihood of deriving pleasure from behavioral activation (e.g., assertiveness skills, developing an exercise plan, less internal/more external focus, increased social involvement); reinforce success. Engage the client in "behavioral activation," increasing his/her activity level and contact with sources of reward, while identifying processes that inhibit activation. use behavioral  techniques such as instruction, rehearsal, role-playing, role reversal, as needed, to facilitate activity in the client's daily life; reinforce success. 3. Develop healthy interpersonal relationships that lead to the  alleviation and help prevent the relapse of depression. 4. Develop healthy thinking patterns and beliefs about self, others, and the world that lead to the alleviation and help prevent the relapse of depression. 5. Enhance ability to effectively cope with the full variety of life's worries and anxieties. 6. Learn and implement coping skills that result in a reduction of anxiety and worry, and improved daily functioning. Objective Learn and implement problem-solving strategies for realistically addressing worries. Target Date: 2022-11-26 Frequency: Monthly  Progress: 50 Modality: individual  Related Interventions Assign the client a homework exercise in which he/she problem-solves a current problem (see Mastery of Your Anxiety and Worry: Workbook by Adora Fridge and Eliot Ford or Generalized Anxiety Disorder by Eather Colas, and Eliot Ford); review, reinforce success, and provide corrective feedback toward improvement. Teach the client problem-solving strategies involving specifically defining a problem, generating options for addressing it, evaluating the pros and cons of each option, selecting and implementing an optional action, and reevaluating and refining the action. Objective Learn and implement calming skills to reduce overall anxiety and manage anxiety symptoms. Target Date: 2022-11-26 Frequency: Monthly  Progress: 50 Modality: individual  Related Interventions Assign the client to read about progressive muscle relaxation and other calming strategies in relevant books or treatment manuals (e.g., Progressive Relaxation Training by Leroy Kennedy; Mastery of Your Anxiety and Worry: Workbook by Beckie Busing). Assign the client homework each session in which he/she practices relaxation exercises daily, gradually applying them progressively from non-anxiety-provoking to anxiety-provoking situations; review and reinforce success while providing corrective feedback toward improvement. Teach the  client calming/relaxation skills (e.g., applied relaxation, progressive muscle relaxation, cue controlled relaxation; mindful breathing; biofeedback) and how to discriminate better between relaxation and tension; teach the client how to apply these skills to his/her daily life. 7. Recognize, accept, and cope with feelings of depression. 8. Reduce overall frequency, intensity, and duration of the anxiety so that daily functioning is not impaired. 9. Resolve the core conflict that is the source of anxiety. 10. Stabilize anxiety level while increasing ability to function on a daily basis. Diagnosis F43.23   Conditions For Discharge Achievement of treatment goals and objectives   Clint Bolder, LCSW

## 2022-05-22 ENCOUNTER — Ambulatory Visit (HOSPITAL_BASED_OUTPATIENT_CLINIC_OR_DEPARTMENT_OTHER): Payer: 59

## 2022-06-12 ENCOUNTER — Ambulatory Visit (INDEPENDENT_AMBULATORY_CARE_PROVIDER_SITE_OTHER): Payer: 59 | Admitting: Psychology

## 2022-06-12 DIAGNOSIS — F4323 Adjustment disorder with mixed anxiety and depressed mood: Secondary | ICD-10-CM

## 2022-06-12 NOTE — Progress Notes (Signed)
[facility] Counselor/Therapist Progress Note  Patient ID: Joshua Yang, MRN: [id number],    Date: 06/12/2022  Time Spent: 2:00pm - 2:50pm   50 minutes   Treatment Type: Individual Therapy  Reported Symptoms: stress  Mental Status Exam: Appearance:  Casual     Behavior: Appropriate  Motor: Normal  Speech/Language:  Normal Rate  Affect: Appropriate  Mood: normal  Thought process: normal  Thought content:   WNL  Sensory/Perceptual disturbances:   WNL  Orientation: oriented to person, place, time/date, and situation  Attention: Good  Concentration: Good  Memory: WNL  Fund of knowledge:  Good  Insight:   Good  Judgment:  Good  Impulse Control: Good   Risk Assessment: Danger to Self:  No Self-injurious Behavior: No Danger to Others: No Duty to Warn:no Physical Aggression / Violence:No  Access to Firearms a concern: No  Gang Involvement:No   Subjective: Pt present for face-to-face individual therapy via video Webex.  Pt consents to telehealth video session due to COVID 19 pandemic. Location of pt: home Location of therapist: home office.  Pt talked about work.   He had a frustrating interaction with a Mudlogger.   Pt feels more and more that he wants to leave the job at [employer].   Addressed pt's frustrations and worked on problem solving. Pt is applying to new jobs and may consider making a change if offered a position.    Pt has been applying for new jobs.   He hopes to be able to leave [employer] as soon as possible since he does not feel valued there.    Pt is torn about what decision to make.  Pt feels anxious about making the right decision.  Worked on Control and instrumentation engineer.   Provided supportive therapy.    Interventions: Cognitive Behavioral Therapy and Insight-Oriented  Diagnosis: F43.23  Plan:  Plan of Care: Treatment Plan  (Treatment Plan Target Date: 11/26/2022) Client Abilities/Strengths  Pt is bright, engaging, and motivated for  therapy.  Client Treatment Preferences  Individual therapy.  Client Statement of Needs  Improve copings skills. Symptoms  Depressed or irritable mood. Excessive and/or unrealistic worry that is difficult to control occurring more days than not for at least 6 months about a number of events or activities. Hypervigilance (e.g., feeling constantly on edge, experiencing concentration difficulties, having trouble falling or staying asleep, exhibiting a general state of irritability). Low self esteem. Problems Addressed  Unipolar Depression, Anxiety Goals 1. Alleviate depressive symptoms and return to previous level of effective functioning. 2. Appropriately grieve the loss in order to normalize mood and to return to previously adaptive level of functioning. Objective Learn and implement behavioral strategies to overcome depression. Target Date: 2022-11-26 Frequency: Monthly  Progress: 50 Modality: individual  Related Interventions Assist the client in developing skills that increase the likelihood of deriving pleasure from behavioral activation (e.g., assertiveness skills, developing an exercise plan, less internal/more external focus, increased social involvement); reinforce success. Engage the client in "behavioral activation," increasing his/her activity level and contact with sources of reward, while identifying processes that inhibit activation. use behavioral techniques such as instruction, rehearsal, role-playing, role reversal, as needed, to facilitate activity in the client's daily life; reinforce success. 3. Develop healthy interpersonal relationships that lead to the alleviation and help prevent the relapse of depression. 4. Develop healthy thinking patterns and beliefs about self, others, and the world that lead to the alleviation and help prevent the relapse of depression. 5. Enhance ability to effectively cope with  the full variety of life's worries and anxieties. 6. Learn and  implement coping skills that result in a reduction of anxiety and worry, and improved daily functioning. Objective Learn and implement problem-solving strategies for realistically addressing worries. Target Date: 2022-11-26 Frequency: Monthly  Progress: 50 Modality: individual  Related Interventions Assign the client a homework exercise in which he/she problem-solves a current problem (see Mastery of Your Anxiety and Worry: Workbook by Adora Fridge and Eliot Ford or Generalized Anxiety Disorder by Eather Colas, and Eliot Ford); review, reinforce success, and provide corrective feedback toward improvement. Teach the client problem-solving strategies involving specifically defining a problem, generating options for addressing it, evaluating the pros and cons of each option, selecting and implementing an optional action, and reevaluating and refining the action. Objective Learn and implement calming skills to reduce overall anxiety and manage anxiety symptoms. Target Date: 2022-11-26 Frequency: Monthly  Progress: 50 Modality: individual  Related Interventions Assign the client to read about progressive muscle relaxation and other calming strategies in relevant books or treatment manuals (e.g., Progressive Relaxation Training by Leroy Kennedy; Mastery of Your Anxiety and Worry: Workbook by Beckie Busing). Assign the client homework each session in which he/she practices relaxation exercises daily, gradually applying them progressively from non-anxiety-provoking to anxiety-provoking situations; review and reinforce success while providing corrective feedback toward improvement. Teach the client calming/relaxation skills (e.g., applied relaxation, progressive muscle relaxation, cue controlled relaxation; mindful breathing; biofeedback) and how to discriminate better between relaxation and tension; teach the client how to apply these skills to his/her daily life. 7. Recognize, accept, and cope with  feelings of depression. 8. Reduce overall frequency, intensity, and duration of the anxiety so that daily functioning is not impaired. 9. Resolve the core conflict that is the source of anxiety. 10. Stabilize anxiety level while increasing ability to function on a daily basis. Diagnosis F43.23   Conditions For Discharge Achievement of treatment goals and objectives   Clint Bolder, LCSW
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
## 2022-06-26 DIAGNOSIS — D169 Benign neoplasm of bone and articular cartilage, unspecified: Secondary | ICD-10-CM | POA: Diagnosis not present

## 2022-06-30 ENCOUNTER — Ambulatory Visit (INDEPENDENT_AMBULATORY_CARE_PROVIDER_SITE_OTHER): Payer: 59 | Admitting: Psychology

## 2022-06-30 DIAGNOSIS — F4323 Adjustment disorder with mixed anxiety and depressed mood: Secondary | ICD-10-CM

## 2022-06-30 NOTE — Progress Notes (Signed)
[facility] Counselor/Therapist Progress Note  Patient ID: Dejour Vos, MRN: [id number],    Date: 06/30/2022  Time Spent: 4:00pm - 4:55pm   55 minutes   Treatment Type: Individual Therapy  Reported Symptoms: stress  Mental Status Exam: Appearance:  Casual     Behavior: Appropriate  Motor: Normal  Speech/Language:  Normal Rate  Affect: Appropriate  Mood: normal  Thought process: normal  Thought content:   WNL  Sensory/Perceptual disturbances:   WNL  Orientation: oriented to person, place, time/date, and situation  Attention: Good  Concentration: Good  Memory: WNL  Fund of knowledge:  Good  Insight:   Good  Judgment:  Good  Impulse Control: Good   Risk Assessment: Danger to Self:  No Self-injurious Behavior: No Danger to Others: No Duty to Warn:no Physical Aggression / Violence:No  Access to Firearms a concern: No  Gang Involvement:No   Subjective: Pt present for face-to-face individual therapy via video Webex.  Pt consents to telehealth video session due to COVID 19 pandemic. Location of pt: home Location of therapist: home office.  Pt talked about work.   He has taken a couple of weeks off work to have a break from work frustrations.   Pt was given feedback that he has been "complacent".  Pt does not understand how he is being complacent.   Addressed how pt can get clarification from his manager.  Addressed how pt's frustrations get in the way of communicating effectively.  Encouraged pt to create space between his thoughts and feelings and reactions.   Encouraged him to take notes and to begin to think about solutions that he can present when he presents problems at work.   Pt is gaining insight about some things he can handle differently rather than externalizing blame.   Worked on self care strategies.   Provided supportive therapy.    Interventions: Cognitive Behavioral Therapy and Insight-Oriented  Diagnosis: F43.23   Plan of Care:  Treatment Plan  (Treatment Plan Target Date: 11/26/2022) Client Abilities/Strengths  Pt is bright, engaging, and motivated for therapy.  Client Treatment Preferences  Individual therapy.  Client Statement of Needs  Improve copings skills. Symptoms  Depressed or irritable mood. Excessive and/or unrealistic worry that is difficult to control occurring more days than not for at least 6 months about a number of events or activities. Hypervigilance (e.g., feeling constantly on edge, experiencing concentration difficulties, having trouble falling or staying asleep, exhibiting a general state of irritability). Low self esteem. Problems Addressed  Unipolar Depression, Anxiety Goals 1. Alleviate depressive symptoms and return to previous level of effective functioning. 2. Appropriately grieve the loss in order to normalize mood and to return to previously adaptive level of functioning. Objective Learn and implement behavioral strategies to overcome depression. Target Date: 2022-11-26 Frequency: Monthly  Progress: 50 Modality: individual  Related Interventions Assist the client in developing skills that increase the likelihood of deriving pleasure from behavioral activation (e.g., assertiveness skills, developing an exercise plan, less internal/more external focus, increased social involvement); reinforce success. Engage the client in "behavioral activation," increasing his/her activity level and contact with sources of reward, while identifying processes that inhibit activation. use behavioral techniques such as instruction, rehearsal, role-playing, role reversal, as needed, to facilitate activity in the client's daily life; reinforce success. 3. Develop healthy interpersonal relationships that lead to the alleviation and help prevent the relapse of depression. 4. Develop healthy thinking patterns and beliefs about self, others, and the world that lead to the alleviation and help  prevent the relapse of  depression. 5. Enhance ability to effectively cope with the full variety of life's worries and anxieties. 6. Learn and implement coping skills that result in a reduction of anxiety and worry, and improved daily functioning. Objective Learn and implement problem-solving strategies for realistically addressing worries. Target Date: 2022-11-26 Frequency: Monthly  Progress: 50 Modality: individual  Related Interventions Assign the client a homework exercise in which he/she problem-solves a current problem (see Mastery of Your Anxiety and Worry: Workbook by Adora Fridge and Eliot Ford or Generalized Anxiety Disorder by Eather Colas, and Eliot Ford); review, reinforce success, and provide corrective feedback toward improvement. Teach the client problem-solving strategies involving specifically defining a problem, generating options for addressing it, evaluating the pros and cons of each option, selecting and implementing an optional action, and reevaluating and refining the action. Objective Learn and implement calming skills to reduce overall anxiety and manage anxiety symptoms. Target Date: 2022-11-26 Frequency: Monthly  Progress: 50 Modality: individual  Related Interventions Assign the client to read about progressive muscle relaxation and other calming strategies in relevant books or treatment manuals (e.g., Progressive Relaxation Training by Leroy Kennedy; Mastery of Your Anxiety and Worry: Workbook by Beckie Busing). Assign the client homework each session in which he/she practices relaxation exercises daily, gradually applying them progressively from non-anxiety-provoking to anxiety-provoking situations; review and reinforce success while providing corrective feedback toward improvement. Teach the client calming/relaxation skills (e.g., applied relaxation, progressive muscle relaxation, cue controlled relaxation; mindful breathing; biofeedback) and how to discriminate better between relaxation  and tension; teach the client how to apply these skills to his/her daily life. 7. Recognize, accept, and cope with feelings of depression. 8. Reduce overall frequency, intensity, and duration of the anxiety so that daily functioning is not impaired. 9. Resolve the core conflict that is the source of anxiety. 10. Stabilize anxiety level while increasing ability to function on a daily basis. Diagnosis F43.23   Conditions For Discharge Achievement of treatment goals and objectives   Joshua Bolder, LCSW

## 2022-07-03 ENCOUNTER — Ambulatory Visit (HOSPITAL_BASED_OUTPATIENT_CLINIC_OR_DEPARTMENT_OTHER): Payer: 59 | Admitting: Family Medicine

## 2022-07-08 ENCOUNTER — Ambulatory Visit (INDEPENDENT_AMBULATORY_CARE_PROVIDER_SITE_OTHER): Payer: 59 | Admitting: Psychology

## 2022-07-08 DIAGNOSIS — F4323 Adjustment disorder with mixed anxiety and depressed mood: Secondary | ICD-10-CM | POA: Diagnosis not present

## 2022-07-08 NOTE — Progress Notes (Signed)
[facility] Counselor/Therapist Progress Note  Patient ID: Joshua Yang, MRN: [id number],    Date: 07/08/2022  Time Spent: 3:00pm - 3:55pm   55 minutes   Treatment Type: Individual Therapy  Reported Symptoms: stress  Mental Status Exam: Appearance:  Casual     Behavior: Appropriate  Motor: Normal  Speech/Language:  Normal Rate  Affect: Appropriate  Mood: normal  Thought process: normal  Thought content:   WNL  Sensory/Perceptual disturbances:   WNL  Orientation: oriented to person, place, time/date, and situation  Attention: Good  Concentration: Good  Memory: WNL  Fund of knowledge:  Good  Insight:   Good  Judgment:  Good  Impulse Control: Good   Risk Assessment: Danger to Self:  No Self-injurious Behavior: No Danger to Others: No Duty to Warn:no Physical Aggression / Violence:No  Access to Firearms a concern: No  Gang Involvement:No   Subjective: Pt present for face-to-face individual therapy via video Webex.  Pt consents to telehealth video session due to COVID 19 pandemic. Location of pt: home Location of therapist: home office.  Pt talked about the holidays.   He will have Thanksgiving dinner with his father and then go to his mother's family to spend time with them. Pt talked about work.  He is looking for new jobs and is hoping to get a job with [employer].    Pt returns to work at [employer] the end of the week.   He is feeling ready to go back but worries about how the managers will react to him for taking these 2 weeks off.   Addressed how pt's frustrations get in the way of communicating effectively.  Encouraged pt to create space between his thoughts and feelings and reactions.   Encouraged him to take notes and to begin to think about solutions that he can present when he presents problems at work.   Pt is gaining insight about some things he can handle differently rather than externalizing blame.   Pt talked about his relationship  with his father.   Helped pt process relationship dynamics.   Worked on self care strategies.   Provided supportive therapy.    Interventions: Cognitive Behavioral Therapy and Insight-Oriented  Diagnosis: F43.23   Plan of Care: Treatment Plan  (Treatment Plan Target Date: 11/26/2022) Client Abilities/Strengths  Pt is bright, engaging, and motivated for therapy.  Client Treatment Preferences  Individual therapy.  Client Statement of Needs  Improve copings skills. Symptoms  Depressed or irritable mood. Excessive and/or unrealistic worry that is difficult to control occurring more days than not for at least 6 months about a number of events or activities. Hypervigilance (e.g., feeling constantly on edge, experiencing concentration difficulties, having trouble falling or staying asleep, exhibiting a general state of irritability). Low self esteem. Problems Addressed  Unipolar Depression, Anxiety Goals 1. Alleviate depressive symptoms and return to previous level of effective functioning. 2. Appropriately grieve the loss in order to normalize mood and to return to previously adaptive level of functioning. Objective Learn and implement behavioral strategies to overcome depression. Target Date: 2022-11-26 Frequency: Monthly  Progress: 50 Modality: individual  Related Interventions Assist the client in developing skills that increase the likelihood of deriving pleasure from behavioral activation (e.g., assertiveness skills, developing an exercise plan, less internal/more external focus, increased social involvement); reinforce success. Engage the client in "behavioral activation," increasing his/her activity level and contact with sources of reward, while identifying processes that inhibit activation. use behavioral techniques such as instruction, rehearsal,  role-playing, role reversal, as needed, to facilitate activity in the client's daily life; reinforce success. 3. Develop healthy  interpersonal relationships that lead to the alleviation and help prevent the relapse of depression. 4. Develop healthy thinking patterns and beliefs about self, others, and the world that lead to the alleviation and help prevent the relapse of depression. 5. Enhance ability to effectively cope with the full variety of life's worries and anxieties. 6. Learn and implement coping skills that result in a reduction of anxiety and worry, and improved daily functioning. Objective Learn and implement problem-solving strategies for realistically addressing worries. Target Date: 2022-11-26 Frequency: Monthly  Progress: 50 Modality: individual  Related Interventions Assign the client a homework exercise in which he/she problem-solves a current problem (see Mastery of Your Anxiety and Worry: Workbook by Adora Fridge and Eliot Ford or Generalized Anxiety Disorder by Eather Colas, and Eliot Ford); review, reinforce success, and provide corrective feedback toward improvement. Teach the client problem-solving strategies involving specifically defining a problem, generating options for addressing it, evaluating the pros and cons of each option, selecting and implementing an optional action, and reevaluating and refining the action. Objective Learn and implement calming skills to reduce overall anxiety and manage anxiety symptoms. Target Date: 2022-11-26 Frequency: Monthly  Progress: 50 Modality: individual  Related Interventions Assign the client to read about progressive muscle relaxation and other calming strategies in relevant books or treatment manuals (e.g., Progressive Relaxation Training by Leroy Kennedy; Mastery of Your Anxiety and Worry: Workbook by Beckie Busing). Assign the client homework each session in which he/she practices relaxation exercises daily, gradually applying them progressively from non-anxiety-provoking to anxiety-provoking situations; review and reinforce success while providing  corrective feedback toward improvement. Teach the client calming/relaxation skills (e.g., applied relaxation, progressive muscle relaxation, cue controlled relaxation; mindful breathing; biofeedback) and how to discriminate better between relaxation and tension; teach the client how to apply these skills to his/her daily life. 7. Recognize, accept, and cope with feelings of depression. 8. Reduce overall frequency, intensity, and duration of the anxiety so that daily functioning is not impaired. 9. Resolve the core conflict that is the source of anxiety. 10. Stabilize anxiety level while increasing ability to function on a daily basis. Diagnosis F43.23   Conditions For Discharge Achievement of treatment goals and objectives   Clint Bolder, LCSW

## 2022-08-05 ENCOUNTER — Ambulatory Visit (INDEPENDENT_AMBULATORY_CARE_PROVIDER_SITE_OTHER): Payer: 59 | Admitting: Psychology

## 2022-08-05 DIAGNOSIS — F4323 Adjustment disorder with mixed anxiety and depressed mood: Secondary | ICD-10-CM | POA: Diagnosis not present

## 2022-08-05 NOTE — Progress Notes (Signed)
[facility]or/Therapist Progress Note  Patient ID: Joshua Yang, MRN: [id number],    Date: 08/05/2022  Time Spent: 2:00pm - 2:50pm   50 minutes   Treatment Type: Individual Therapy  Reported Symptoms: stress  Mental Status Exam: Appearance:  Casual     Behavior: Appropriate  Motor: Normal  Speech/Language:  Normal Rate  Affect: Appropriate  Mood: normal  Thought process: normal  Thought content:   WNL  Sensory/Perceptual disturbances:   WNL  Orientation: oriented to person, place, time/date, and situation  Attention: Good  Concentration: Good  Memory: WNL  Fund of knowledge:  Good  Insight:   Good  Judgment:  Good  Impulse Control: Good   Risk Assessment: Danger to Self:  No Self-injurious Behavior: No Danger to Others: No Duty to Warn:no Physical Aggression / Violence:No  Access to Firearms a concern: No  Gang Involvement:No   Subjective: Pt present for face-to-face individual therapy via video Webex.  Pt consents to telehealth video session due to COVID 19 pandemic. Location of pt: home Location of therapist: home office.  Pt talked about work.  He has returned to work at [employer] and he states he has a lot of mixed feelings about it.  Pt feels like leadership is not happy with him for taking the 2 week leave of absence.   Pt is glad to be back with his coworkers and likes the job but is not happy with management.   Pt has been applying for other jobs and still hopes to get employed by [employer].   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  Addressed pt's frustrations with work.   Addressed how pt's frustrations get in the way of communicating effectively.  Encouraged pt to create space between his thoughts and feelings and reactions.    Pt talked about the holidays.  He misses Barth Kirks who passed away a few years ago.  Helped pt process his feelings and grief.   Pt talked about his relationship with his  father.   Addressed an incident where they had conflict recently.  Helped pt process relationship dynamics.   Worked on self care strategies.   Provided supportive therapy.    Interventions: Cognitive Behavioral Therapy and Insight-Oriented  Diagnosis: F43.23   Plan of Care: Treatment Plan  (Treatment Plan Target Date: 11/26/2022) Client Abilities/Strengths  Pt is bright, engaging, and motivated for therapy.  Client Treatment Preferences  Individual therapy.  Client Statement of Needs  Improve copings skills. Symptoms  Depressed or irritable mood. Excessive and/or unrealistic worry that is difficult to control occurring more days than not for at least 6 months about a number of events or activities. Hypervigilance (e.g., feeling constantly on edge, experiencing concentration difficulties, having trouble falling or staying asleep, exhibiting a general state of irritability). Low self esteem. Problems Addressed  Unipolar Depression, Anxiety Goals 1. Alleviate depressive symptoms and return to previous level of effective functioning. 2. Appropriately grieve the loss in order to normalize mood and to return to previously adaptive level of functioning. Objective Learn and implement behavioral strategies to overcome depression. Target Date: 2022-11-26 Frequency: Monthly  Progress: 50 Modality: individual  Related Interventions Assist the client in developing skills that increase the likelihood of deriving pleasure from behavioral activation (e.g., assertiveness skills, developing an exercise plan, less internal/more external focus, increased social involvement); reinforce success. Engage the client in "behavioral activation," increasing his/her activity level and contact with sources of reward, while identifying processes  that inhibit activation. use behavioral techniques such as instruction, rehearsal, role-playing, role reversal, as needed, to facilitate activity in the client's daily life;  reinforce success. 3. Develop healthy interpersonal relationships that lead to the alleviation and help prevent the relapse of depression. 4. Develop healthy thinking patterns and beliefs about self, others, and the world that lead to the alleviation and help prevent the relapse of depression. 5. Enhance ability to effectively cope with the full variety of life's worries and anxieties. 6. Learn and implement coping skills that result in a reduction of anxiety and worry, and improved daily functioning. Objective Learn and implement problem-solving strategies for realistically addressing worries. Target Date: 2022-11-26 Frequency: Monthly  Progress: 50 Modality: individual  Related Interventions Assign the client a homework exercise in which he/she problem-solves a current problem (see Mastery of Your Anxiety and Worry: Workbook by Adora Fridge and Eliot Ford or Generalized Anxiety Disorder by Eather Colas, and Eliot Ford); review, reinforce success, and provide corrective feedback toward improvement. Teach the client problem-solving strategies involving specifically defining a problem, generating options for addressing it, evaluating the pros and cons of each option, selecting and implementing an optional action, and reevaluating and refining the action. Objective Learn and implement calming skills to reduce overall anxiety and manage anxiety symptoms. Target Date: 2022-11-26 Frequency: Monthly  Progress: 50 Modality: individual  Related Interventions Assign the client to read about progressive muscle relaxation and other calming strategies in relevant books or treatment manuals (e.g., Progressive Relaxation Training by Leroy Kennedy; Mastery of Your Anxiety and Worry: Workbook by Beckie Busing). Assign the client homework each session in which he/she practices relaxation exercises daily, gradually applying them progressively from non-anxiety-provoking to anxiety-provoking situations; review and  reinforce success while providing corrective feedback toward improvement. Teach the client calming/relaxation skills (e.g., applied relaxation, progressive muscle relaxation, cue controlled relaxation; mindful breathing; biofeedback) and how to discriminate better between relaxation and tension; teach the client how to apply these skills to his/her daily life. 7. Recognize, accept, and cope with feelings of depression. 8. Reduce overall frequency, intensity, and duration of the anxiety so that daily functioning is not impaired. 9. Resolve the core conflict that is the source of anxiety. 10. Stabilize anxiety level while increasing ability to function on a daily basis. Diagnosis F43.23   Conditions For Discharge Achievement of treatment goals and objectives   Clint Bolder, LCSW

## 2022-09-04 ENCOUNTER — Ambulatory Visit (INDEPENDENT_AMBULATORY_CARE_PROVIDER_SITE_OTHER): Payer: 59 | Admitting: Psychology

## 2022-09-04 DIAGNOSIS — F4323 Adjustment disorder with mixed anxiety and depressed mood: Secondary | ICD-10-CM | POA: Diagnosis not present

## 2022-09-04 NOTE — Progress Notes (Signed)
[place] Counselor/Therapist Progress Note  Patient ID: Joshua Yang, MRN: [id number],    Date: 09/04/2022  Time Spent: 3:00pm - 3:50pm   50 minutes   Treatment Type: Individual Therapy  Reported Symptoms: stress  Mental Status Exam: Appearance:  Casual     Behavior: Appropriate  Motor: Normal  Speech/Language:  Normal Rate  Affect: Appropriate  Mood: normal  Thought process: normal  Thought content:   WNL  Sensory/Perceptual disturbances:   WNL  Orientation: oriented to person, place, time/date, and situation  Attention: Good  Concentration: Good  Memory: WNL  Fund of knowledge:  Good  Insight:   Good  Judgment:  Good  Impulse Control: Good   Risk Assessment: Danger to Self:  No Self-injurious Behavior: No Danger to Others: No Duty to Warn:no Physical Aggression / Violence:No  Access to Firearms a concern: No  Gang Involvement:No   Subjective: Pt present for face-to-face individual therapy via video Webex.  Pt consents to telehealth video session due to COVID 19 pandemic. Location of pt: home Location of therapist: home office.  Pt talked about work.  He is hopeful he can leave working at [employer] soon.   He interviewed with [employer] and was offered a position.  Pt is very excited about the job change.  The job at [employer] will be full time working Monday-Friday.   Pt talked about his New Year resolution.   He wants to improve on being more consistent with taking his anxiety and adhd medication.  He can tend to forget to take them.   Helped pt problem solve.  Pt is hopeful that 2024 will be a good year for him. Pt talked about starting to date someone online who lives in [place].  Addressed the dynamics of the relationship.   They talk on the phone a lot.   Worked on self care strategies.   Provided supportive therapy.    Interventions: Cognitive Behavioral Therapy and Insight-Oriented  Diagnosis: F43.23   Plan of Care:  Treatment Plan  (Treatment Plan Target Date: 11/26/2022) Client Abilities/Strengths  Pt is bright, engaging, and motivated for therapy.  Client Treatment Preferences  Individual therapy.  Client Statement of Needs  Improve copings skills. Symptoms  Depressed or irritable mood. Excessive and/or unrealistic worry that is difficult to control occurring more days than not for at least 6 months about a number of events or activities. Hypervigilance (e.g., feeling constantly on edge, experiencing concentration difficulties, having trouble falling or staying asleep, exhibiting a general state of irritability). Low self esteem. Problems Addressed  Unipolar Depression, Anxiety Goals 1. Alleviate depressive symptoms and return to previous level of effective functioning. 2. Appropriately grieve the loss in order to normalize mood and to return to previously adaptive level of functioning. Objective Learn and implement behavioral strategies to overcome depression. Target Date: 2022-11-26 Frequency: Monthly  Progress: 50 Modality: individual  Related Interventions Assist the client in developing skills that increase the likelihood of deriving pleasure from behavioral activation (e.g., assertiveness skills, developing an exercise plan, less internal/more external focus, increased social involvement); reinforce success. Engage the client in "behavioral activation," increasing his/her activity level and contact with sources of reward, while identifying processes that inhibit activation. use behavioral techniques such as instruction, rehearsal, role-playing, role reversal, as needed, to facilitate activity in the client's daily life; reinforce success. 3. Develop healthy interpersonal relationships that lead to the alleviation and help prevent the relapse of depression. 4. Develop healthy thinking patterns and beliefs about self,  others, and the world that lead to the alleviation and help prevent the relapse of  depression. 5. Enhance ability to effectively cope with the full variety of life's worries and anxieties. 6. Learn and implement coping skills that result in a reduction of anxiety and worry, and improved daily functioning. Objective Learn and implement problem-solving strategies for realistically addressing worries. Target Date: 2022-11-26 Frequency: Monthly  Progress: 50 Modality: individual  Related Interventions Assign the client a homework exercise in which he/she problem-solves a current problem (see Mastery of Your Anxiety and Worry: Workbook by Adora Fridge and Eliot Ford or Generalized Anxiety Disorder by Eather Colas, and Eliot Ford); review, reinforce success, and provide corrective feedback toward improvement. Teach the client problem-solving strategies involving specifically defining a problem, generating options for addressing it, evaluating the pros and cons of each option, selecting and implementing an optional action, and reevaluating and refining the action. Objective Learn and implement calming skills to reduce overall anxiety and manage anxiety symptoms. Target Date: 2022-11-26 Frequency: Monthly  Progress: 50 Modality: individual  Related Interventions Assign the client to read about progressive muscle relaxation and other calming strategies in relevant books or treatment manuals (e.g., Progressive Relaxation Training by Leroy Kennedy; Mastery of Your Anxiety and Worry: Workbook by Beckie Busing). Assign the client homework each session in which he/she practices relaxation exercises daily, gradually applying them progressively from non-anxiety-provoking to anxiety-provoking situations; review and reinforce success while providing corrective feedback toward improvement. Teach the client calming/relaxation skills (e.g., applied relaxation, progressive muscle relaxation, cue controlled relaxation; mindful breathing; biofeedback) and how to discriminate better between relaxation  and tension; teach the client how to apply these skills to his/her daily life. 7. Recognize, accept, and cope with feelings of depression. 8. Reduce overall frequency, intensity, and duration of the anxiety so that daily functioning is not impaired. 9. Resolve the core conflict that is the source of anxiety. 10. Stabilize anxiety level while increasing ability to function on a daily basis. Diagnosis F43.23   Conditions For Discharge Achievement of treatment goals and objectives   Clint Bolder, LCSW

## 2022-10-02 ENCOUNTER — Ambulatory Visit: Payer: Self-pay | Admitting: Psychology

## 2022-10-30 ENCOUNTER — Ambulatory Visit: Payer: Commercial Managed Care - PPO | Admitting: Psychology

## 2022-11-14 ENCOUNTER — Other Ambulatory Visit: Payer: Self-pay

## 2022-11-14 ENCOUNTER — Other Ambulatory Visit (HOSPITAL_COMMUNITY): Payer: Self-pay

## 2022-11-27 ENCOUNTER — Ambulatory Visit: Payer: Commercial Managed Care - PPO | Admitting: Psychology

## 2022-12-23 ENCOUNTER — Other Ambulatory Visit (HOSPITAL_BASED_OUTPATIENT_CLINIC_OR_DEPARTMENT_OTHER): Payer: Self-pay | Admitting: Family Medicine

## 2022-12-23 ENCOUNTER — Other Ambulatory Visit (HOSPITAL_COMMUNITY): Payer: Self-pay

## 2022-12-23 DIAGNOSIS — F902 Attention-deficit hyperactivity disorder, combined type: Secondary | ICD-10-CM

## 2022-12-24 ENCOUNTER — Other Ambulatory Visit (HOSPITAL_COMMUNITY): Payer: Self-pay

## 2022-12-25 ENCOUNTER — Encounter (HOSPITAL_BASED_OUTPATIENT_CLINIC_OR_DEPARTMENT_OTHER): Payer: Self-pay | Admitting: Family Medicine

## 2022-12-31 ENCOUNTER — Other Ambulatory Visit (HOSPITAL_BASED_OUTPATIENT_CLINIC_OR_DEPARTMENT_OTHER): Payer: Self-pay

## 2022-12-31 ENCOUNTER — Encounter (HOSPITAL_BASED_OUTPATIENT_CLINIC_OR_DEPARTMENT_OTHER): Payer: Self-pay | Admitting: Family Medicine

## 2022-12-31 ENCOUNTER — Ambulatory Visit (INDEPENDENT_AMBULATORY_CARE_PROVIDER_SITE_OTHER): Payer: 59 | Admitting: Family Medicine

## 2022-12-31 DIAGNOSIS — F902 Attention-deficit hyperactivity disorder, combined type: Secondary | ICD-10-CM

## 2022-12-31 MED ORDER — METHYLPHENIDATE HCL ER (OSM) 36 MG PO TBCR
36.0000 mg | EXTENDED_RELEASE_TABLET | Freq: Every day | ORAL | 0 refills | Status: DC
  Filled 2022-12-31: qty 30, 30d supply, fill #0

## 2022-12-31 NOTE — Progress Notes (Signed)
   Established Patient Office Visit  Subjective   Patient ID: Joshua Yang, male    DOB: 2000/12/06  Age: 22 y.o. MRN: [id number]  Joshua Yang is a 22 yo male patient who presents for ADHD follow-up.   Concerta 36mg  daily He was working at [employer] job with no consistent schedule and was finding it was difficult to take regularly.   Reports he now has a job with consistent hours and takes his medication in the morning and feels like it wears off at the end of the day. He reports doing well on this dose and would like to stay on this dose.    Review of Systems  Constitutional:  Negative for malaise/fatigue.  HENT:  Negative for tinnitus.   Respiratory:  Negative for cough and shortness of breath.   Cardiovascular:  Negative for chest pain and palpitations.  Gastrointestinal:  Negative for abdominal pain, nausea and vomiting.  Neurological:  Negative for dizziness, weakness and headaches.  Psychiatric/Behavioral:  Negative for depression and suicidal ideas. The patient is not nervous/anxious.       Objective:    BP 115/67   Pulse 78   Ht 5\' 9"  (1.753 m)   Wt 214 lb (97.1 kg)   SpO2 99%   BMI 31.60 kg/m  BP Readings from Last 3 Encounters:  12/31/22 115/67  04/03/22 126/74  03/21/22 127/75     Physical Exam Constitutional:      Appearance: Normal appearance.  Cardiovascular:     Rate and Rhythm: Normal rate and regular rhythm.     Pulses: Normal pulses.     Heart sounds: Normal heart sounds.  Pulmonary:     Effort: Pulmonary effort is normal.     Breath sounds: Normal breath sounds.  Neurological:     Mental Status: He is alert.  Psychiatric:        Mood and Affect: Mood normal.        Behavior: Behavior normal.        Thought Content: Thought content normal.        Judgment: Judgment normal.    Assessment & Plan:  1. Attention deficit hyperactivity disorder (ADHD), combined type Patient has been taking methylphenidate 36mg  daily.  He does  feel that medication has helped with controlling symptoms and has not noticed adverse side effects with this medication dose. On exam, patient is in no acute distress, vital signs stable. Cardiovascular exam with regular rate and rhythm, lungs clear to auscultation bilaterally.  PDMP reviewed, no red flags. Refill provided today. Plan for follow-up in 3 months for medication management or sooner as needed.   - methylphenidate (CONCERTA) 36 MG PO CR tablet; Take 1 tablet (36 mg total) by mouth daily.  Dispense: 30 tablet; Refill: 0    Return in about 3 months (around 04/02/2023) for ADHD f/u.    Alyson Reedy, FNP

## 2023-01-26 ENCOUNTER — Other Ambulatory Visit (HOSPITAL_BASED_OUTPATIENT_CLINIC_OR_DEPARTMENT_OTHER): Payer: Self-pay | Admitting: Family Medicine

## 2023-01-26 DIAGNOSIS — F902 Attention-deficit hyperactivity disorder, combined type: Secondary | ICD-10-CM

## 2023-01-28 ENCOUNTER — Other Ambulatory Visit (HOSPITAL_BASED_OUTPATIENT_CLINIC_OR_DEPARTMENT_OTHER): Payer: Self-pay

## 2023-01-28 ENCOUNTER — Encounter (HOSPITAL_BASED_OUTPATIENT_CLINIC_OR_DEPARTMENT_OTHER): Payer: Self-pay

## 2023-01-28 ENCOUNTER — Encounter (HOSPITAL_BASED_OUTPATIENT_CLINIC_OR_DEPARTMENT_OTHER): Payer: Self-pay | Admitting: Family Medicine

## 2023-01-28 MED ORDER — METHYLPHENIDATE HCL ER (OSM) 36 MG PO TBCR
36.0000 mg | EXTENDED_RELEASE_TABLET | Freq: Every day | ORAL | 0 refills | Status: DC
  Filled 2023-01-28: qty 30, 30d supply, fill #0

## 2023-01-29 ENCOUNTER — Other Ambulatory Visit (HOSPITAL_BASED_OUTPATIENT_CLINIC_OR_DEPARTMENT_OTHER): Payer: Self-pay

## 2023-01-29 NOTE — Telephone Encounter (Signed)
Dr. De Peru, please see mychart messages and advise.

## 2023-01-30 ENCOUNTER — Other Ambulatory Visit (HOSPITAL_BASED_OUTPATIENT_CLINIC_OR_DEPARTMENT_OTHER): Payer: Self-pay

## 2023-01-30 ENCOUNTER — Other Ambulatory Visit (HOSPITAL_BASED_OUTPATIENT_CLINIC_OR_DEPARTMENT_OTHER): Payer: Self-pay | Admitting: Family Medicine

## 2023-01-30 DIAGNOSIS — F902 Attention-deficit hyperactivity disorder, combined type: Secondary | ICD-10-CM

## 2023-02-02 ENCOUNTER — Other Ambulatory Visit: Payer: Self-pay

## 2023-02-02 ENCOUNTER — Other Ambulatory Visit (HOSPITAL_BASED_OUTPATIENT_CLINIC_OR_DEPARTMENT_OTHER): Payer: Self-pay

## 2023-02-02 MED ORDER — METHYLPHENIDATE HCL ER (CD) 40 MG PO CPCR
40.0000 mg | ORAL_CAPSULE | ORAL | 0 refills | Status: DC
  Filled 2023-02-02: qty 30, 30d supply, fill #0

## 2023-02-02 NOTE — Telephone Encounter (Signed)
Please advise on refill request

## 2023-02-02 NOTE — Addendum Note (Signed)
Addended by: DE Peru, Rahmir Beever J on: 02/02/2023 08:18 AM   Modules accepted: Orders

## 2023-02-03 ENCOUNTER — Other Ambulatory Visit (HOSPITAL_BASED_OUTPATIENT_CLINIC_OR_DEPARTMENT_OTHER): Payer: Self-pay

## 2023-02-03 MED ORDER — METHYLPHENIDATE HCL ER 25 MG PO CP24
25.0000 mg | ORAL_CAPSULE | Freq: Every day | ORAL | 0 refills | Status: DC
  Filled 2023-02-03: qty 30, 30d supply, fill #0

## 2023-02-03 MED ORDER — METHYLPHENIDATE HCL ER (OSM) 36 MG PO TBCR
36.0000 mg | EXTENDED_RELEASE_TABLET | Freq: Every day | ORAL | 0 refills | Status: DC
  Filled 2023-02-03: qty 30, 30d supply, fill #0

## 2023-02-03 NOTE — Addendum Note (Signed)
Addended by: DE Peru, Morio Widen J on: 02/03/2023 12:54 PM   Modules accepted: Orders

## 2023-02-27 ENCOUNTER — Other Ambulatory Visit: Payer: Self-pay | Admitting: Oncology

## 2023-02-27 DIAGNOSIS — Z006 Encounter for examination for normal comparison and control in clinical research program: Secondary | ICD-10-CM

## 2023-04-02 ENCOUNTER — Ambulatory Visit (INDEPENDENT_AMBULATORY_CARE_PROVIDER_SITE_OTHER): Payer: 59 | Admitting: Family Medicine

## 2023-04-02 ENCOUNTER — Other Ambulatory Visit (HOSPITAL_BASED_OUTPATIENT_CLINIC_OR_DEPARTMENT_OTHER): Payer: Self-pay

## 2023-04-02 ENCOUNTER — Encounter (HOSPITAL_BASED_OUTPATIENT_CLINIC_OR_DEPARTMENT_OTHER): Payer: Self-pay | Admitting: Family Medicine

## 2023-04-02 VITALS — BP 147/83 | HR 80 | Ht 69.0 in | Wt 232.1 lb

## 2023-04-02 DIAGNOSIS — J301 Allergic rhinitis due to pollen: Secondary | ICD-10-CM | POA: Diagnosis not present

## 2023-04-02 DIAGNOSIS — F32A Depression, unspecified: Secondary | ICD-10-CM

## 2023-04-02 DIAGNOSIS — F902 Attention-deficit hyperactivity disorder, combined type: Secondary | ICD-10-CM

## 2023-04-02 MED ORDER — LEVOCETIRIZINE DIHYDROCHLORIDE 5 MG PO TABS
5.0000 mg | ORAL_TABLET | Freq: Every evening | ORAL | 1 refills | Status: DC
  Filled 2023-04-02: qty 90, 90d supply, fill #0

## 2023-04-02 MED ORDER — SERTRALINE HCL 100 MG PO TABS
100.0000 mg | ORAL_TABLET | Freq: Every day | ORAL | 1 refills | Status: DC
  Filled 2023-04-02: qty 90, 90d supply, fill #0

## 2023-04-02 MED ORDER — METHYLPHENIDATE HCL ER (OSM) 36 MG PO TBCR
36.0000 mg | EXTENDED_RELEASE_TABLET | Freq: Every day | ORAL | 0 refills | Status: DC
  Filled 2023-04-02: qty 30, 30d supply, fill #0

## 2023-04-02 NOTE — Assessment & Plan Note (Signed)
Patient has been doing well with sertraline at 100 mg daily.  Denies any side effects of medication.  Feels that dose is helping to control symptoms appropriately.  Would like to continue with 100 mg dose at this time which I feel is reasonable, refill sent to pharmacy on file

## 2023-04-02 NOTE — Progress Notes (Signed)
    Procedures performed today:    None.  Independent interpretation of notes and tests performed by another provider:   None.  Brief History, Exam, Impression, and Recommendations:    BP (!) 147/83   Pulse 80   Ht 5\' 9"  (1.753 m)   Wt 232 lb 1.6 oz (105.3 kg)   SpO2 100%   BMI 34.28 kg/m   Attention deficit hyperactivity disorder (ADHD), combined type Assessment & Plan: Patient continues to do well with Concerta 36 mg dose, denies any side effects today.  Has been out of the medication for a little.  Denies any sleep-related concerns, no issues with chest pain, palpitations, appetite issues.  Today, PDMP reviewed, no red flags, will proceed with refill today   Non-seasonal allergic rhinitis due to pollen -     Levocetirizine Dihydrochloride; Take 1 tablet (5 mg total) by mouth every evening.  Dispense: 90 tablet; Refill: 1  Depression, unspecified depression type Assessment & Plan: Patient has been doing well with sertraline at 100 mg daily.  Denies any side effects of medication.  Feels that dose is helping to control symptoms appropriately.  Would like to continue with 100 mg dose at this time which I feel is reasonable, refill sent to pharmacy on file  Orders: -     Sertraline HCl; Take 1 tablet (100 mg total) by mouth daily.  Dispense: 90 tablet; Refill: 1  Other orders -     Methylphenidate HCl ER (OSM); Take 1 tablet (36 mg total) by mouth daily.  Dispense: 30 tablet; Refill: 0  Return in about 3 months (around 07/03/2023) for med check.   ___________________________________________ Mycah Mcdougall de [place], MD, ABFM, [facility]

## 2023-04-02 NOTE — Assessment & Plan Note (Signed)
Patient continues to do well with Concerta 36 mg dose, denies any side effects today.  Has been out of the medication for a little.  Denies any sleep-related concerns, no issues with chest pain, palpitations, appetite issues.  Today, PDMP reviewed, no red flags, will proceed with refill today

## 2023-04-08 ENCOUNTER — Encounter (HOSPITAL_BASED_OUTPATIENT_CLINIC_OR_DEPARTMENT_OTHER): Payer: 59 | Admitting: Family Medicine

## 2023-04-23 ENCOUNTER — Ambulatory Visit (INDEPENDENT_AMBULATORY_CARE_PROVIDER_SITE_OTHER): Payer: 59 | Admitting: Family Medicine

## 2023-04-23 VITALS — BP 154/91 | HR 94 | Ht 69.0 in | Wt 233.9 lb

## 2023-04-23 DIAGNOSIS — Z23 Encounter for immunization: Secondary | ICD-10-CM | POA: Diagnosis not present

## 2023-04-23 DIAGNOSIS — Z0000 Encounter for general adult medical examination without abnormal findings: Secondary | ICD-10-CM

## 2023-04-23 NOTE — Progress Notes (Signed)
Subjective:    CC: Annual Physical Exam  HPI:  Joshua Yang is a 22 y.o. presenting for annual physical  I reviewed the past medical history, family history, social history, surgical history, and allergies today and no changes were needed.  Please see the problem list section below in epic for further details.  Past Medical History: Past Medical History:  Diagnosis Date   Allergy    seasonal   Anxiety     no medications   Asthma    allergy induced asthma   Cellulitis    hx of   Depression    Family history of adverse reaction to anesthesia    mother had historu of running a high fever after surgery, stated it is not malignant hyperthermia   Hearing loss, conductive, bilateral    Impetigo    hx of   Multiple hereditary osteochondromas    Staph skin infection    Vision abnormalities    wears glasses   Past Surgical History: Past Surgical History:  Procedure Laterality Date   EAR TUBE REMOVAL     OSTEOCHONDROMA EXCISION     x2 one on heel and one on upper thigh   OSTEOCHONDROMA EXCISION  03/10/2012   Procedure: OSTEOCHONDROMA EXCISION;  Surgeon: Nadara Mustard, MD;  Location: [facility] OR;  Service: Orthopedics;  Laterality: Right;  Excision distal right tibia and fibula osteochondroma   OSTEOCHONDROMA EXCISION Right 03/30/2013   Procedure: EXCISION OSTEOCHONDROMA RIGHT SHOULDER AND RIGHT WRIST;  Surgeon: Nadara Mustard, MD;  Location: [facility] OR;  Service: Orthopedics;  Laterality: Right;   OSTEOCHONDROMA EXCISION Bilateral 07/11/2015   Procedure: OSTEOCHONDROMA EXCISION BILATERAL MEDIAL TIBIAL PLATEAU;  Surgeon: Nadara Mustard, MD;  Location: [facility] OR;  Service: Orthopedics;  Laterality: Bilateral;   OSTEOCHONDROMA EXCISION Left 04/01/2017   Procedure: OSTEOCHONDROMA EXCISION x2 LEFT WRIST;  Surgeon: Nadara Mustard, MD;  Location: [facility];  Service: Orthopedics;  Laterality: Left;   TYMPANOSTOMY TUBE PLACEMENT     Social History: Social History   Socioeconomic History    Marital status: Single    Spouse name: Not on file   Number of children: Not on file   Years of education: Not on file   Highest education level: Some college, no degree  Occupational History   Not on file  Tobacco Use   Smoking status: Never   Smokeless tobacco: Never   Tobacco comments:    No smokers in home  Substance and Sexual Activity   Alcohol use: No   Drug use: No   Sexual activity: Not Currently    Birth control/protection: None  Other Topics Concern   Not on file  Social History Narrative   Not on file   Social Determinants of Health   Financial Resource Strain: Low Risk  (04/23/2023)   Overall Financial Resource Strain (CARDIA)    Difficulty of Paying Living Expenses: Not very hard  Food Insecurity: No Food Insecurity (04/23/2023)   Hunger Vital Sign    Worried About Running Out of Food in the Last Year: Never true    Ran Out of Food in the Last Year: Never true  Transportation Needs: No Transportation Needs (04/23/2023)   PRAPARE - Administrator, Civil Service (Medical): No    Lack of Transportation (Non-Medical): No  Physical Activity: Sufficiently Active (04/23/2023)   Exercise Vital Sign    Days of Exercise per Week: 2 days    Minutes of Exercise per Session: 120 min  Stress: No Stress  Concern Present (04/23/2023)   [employer] of Occupational Health - Occupational Stress Questionnaire    Feeling of Stress : Only a little  Social Connections: Socially Isolated (04/23/2023)   Social Connection and Isolation Panel [NHANES]    Frequency of Communication with Friends and Family: Twice a week    Frequency of Social Gatherings with Friends and Family: Once a week    Attends Religious Services: Never    Database administrator or Organizations: No    Attends Engineer, structural: Not on file    Marital Status: Never married   Family History: Family History  Problem Relation Age of Onset   Diabetes Other    Hyperlipidemia Father     Diabetes Maternal Grandfather    Hyperlipidemia Maternal Grandfather    Hypertension Maternal Grandfather    Arthritis Maternal Grandfather    Alcohol abuse Paternal Grandmother    Arthritis Paternal Grandmother    Arthritis Mother    Scoliosis Mother    Asthma Mother    Depression Mother    ADD / ADHD Mother    Arthritis Maternal Grandmother    Arthritis Paternal Grandfather    Diabetes Other    Stroke Sister    Kidney disease Other    Diabetes Other    Allergies: Allergies  Allergen Reactions   Latex Other (See Comments)    Skin irritation   Amoxicillin-Pot Clavulanate Diarrhea and Nausea And Vomiting    Severe    Tape Rash    Plastic Tape   Medications: See med rec.  Review of Systems: No headache, visual changes, nausea, vomiting, diarrhea, constipation, dizziness, abdominal pain, skin rash, fevers, chills, night sweats, swollen lymph nodes, weight loss, chest pain, body aches, joint swelling, muscle aches, shortness of breath, mood changes, visual or auditory hallucinations.  Objective:    BP (!) 154/91   Pulse 94   Ht 5\' 9"  (1.753 m)   Wt 233 lb 14.4 oz (106.1 kg)   SpO2 100%   BMI 34.54 kg/m   General: Well Developed, well nourished, and in no acute distress.  Neuro: Alert and oriented x3, extra-ocular muscles intact, sensation grossly intact. Cranial nerves II through XII are intact, motor, sensory, and coordinative functions are all intact. HEENT: Normocephalic, atraumatic, pupils equal round reactive to light, neck supple, no masses, no lymphadenopathy, thyroid nonpalpable. Oropharynx, nasopharynx, external ear canals are unremarkable. Skin: Warm and dry, no rashes noted.  Cardiac: Regular rate and rhythm, no murmurs rubs or gallops.  Respiratory: Clear to auscultation bilaterally. Not using accessory muscles, speaking in full sentences.  Abdominal: Soft, nontender, nondistended, positive bowel sounds, no masses, no organomegaly.  Musculoskeletal: Shoulder,  elbow, wrist, hip, knee, ankle stable, and with full range of motion.  Impression and Recommendations:    Wellness examination Assessment & Plan: Routine HCM labs ordered. HCM reviewed/discussed. Anticipatory guidance regarding healthy weight, lifestyle and choices given. Recommend healthy diet.  Recommend approximately 150 minutes/week of moderate intensity exercise Recommend regular dental and vision exams Always use seatbelt/lap and shoulder restraints Recommend using smoke alarms and checking batteries at least twice a year Recommend using sunscreen when outside Discussed immunization recommendations Recommend seasonal influenza vaccine, patient amenable, administered today  Orders: -     CBC with Differential/Platelet -     Comprehensive metabolic panel -     Hemoglobin A1c -     Lipid panel -     TSH Rfx on Abnormal to Free T4  Need for influenza vaccination  Encounter  for immunization -     Flu vaccine trivalent PF, 6mos and older(Flulaval,Afluria,Fluarix,Fluzone)  Return if symptoms worsen or fail to improve.   ___________________________________________ Jachin Coury de Peru, MD, ABFM, CAQSM [facility]

## 2023-04-23 NOTE — Assessment & Plan Note (Signed)
Routine HCM labs ordered. HCM reviewed/discussed. Anticipatory guidance regarding healthy weight, lifestyle and choices given. Recommend healthy diet.  Recommend approximately 150 minutes/week of moderate intensity exercise Recommend regular dental and vision exams Always use seatbelt/lap and shoulder restraints Recommend using smoke alarms and checking batteries at least twice a year Recommend using sunscreen when outside Discussed immunization recommendations Recommend seasonal influenza vaccine, patient amenable, administered today

## 2023-04-24 LAB — CBC WITH DIFFERENTIAL/PLATELET
Basophils Absolute: 0 10*3/uL (ref 0.0–0.2)
Basos: 0 %
EOS (ABSOLUTE): 0.1 10*3/uL (ref 0.0–0.4)
Eos: 2 %
Hematocrit: 45.7 % (ref 37.5–51.0)
Hemoglobin: 15.5 g/dL (ref 13.0–17.7)
Immature Grans (Abs): 0 10*3/uL (ref 0.0–0.1)
Immature Granulocytes: 0 %
Lymphocytes Absolute: 2.5 10*3/uL (ref 0.7–3.1)
Lymphs: 34 %
MCH: 28.2 pg (ref 26.6–33.0)
MCHC: 33.9 g/dL (ref 31.5–35.7)
MCV: 83 fL (ref 79–97)
Monocytes Absolute: 0.5 10*3/uL (ref 0.1–0.9)
Monocytes: 7 %
Neutrophils Absolute: 4 10*3/uL (ref 1.4–7.0)
Neutrophils: 57 %
Platelets: 269 10*3/uL (ref 150–450)
RBC: 5.49 x10E6/uL (ref 4.14–5.80)
RDW: 12.2 % (ref 11.6–15.4)
WBC: 7.2 10*3/uL (ref 3.4–10.8)

## 2023-04-24 LAB — COMPREHENSIVE METABOLIC PANEL
ALT: 24 IU/L (ref 0–44)
AST: 19 IU/L (ref 0–40)
Albumin: 4.6 g/dL (ref 4.3–5.2)
Alkaline Phosphatase: 58 IU/L (ref 44–121)
BUN/Creatinine Ratio: 9 (ref 9–20)
BUN: 9 mg/dL (ref 6–20)
Bilirubin Total: 0.4 mg/dL (ref 0.0–1.2)
CO2: 25 mmol/L (ref 20–29)
Calcium: 10.1 mg/dL (ref 8.7–10.2)
Chloride: 102 mmol/L (ref 96–106)
Creatinine, Ser: 1 mg/dL (ref 0.76–1.27)
Globulin, Total: 2.8 g/dL (ref 1.5–4.5)
Glucose: 101 mg/dL — ABNORMAL HIGH (ref 70–99)
Potassium: 4.4 mmol/L (ref 3.5–5.2)
Sodium: 140 mmol/L (ref 134–144)
Total Protein: 7.4 g/dL (ref 6.0–8.5)
eGFR: 110 mL/min/{1.73_m2} (ref >59)

## 2023-04-24 LAB — LIPID PANEL
Chol/HDL Ratio: 3.5 ratio (ref 0.0–5.0)
Cholesterol, Total: 161 mg/dL (ref 100–199)
HDL: 46 mg/dL (ref >39)
LDL Chol Calc (NIH): 92 mg/dL (ref 0–99)
Triglycerides: 131 mg/dL (ref 0–149)
VLDL Cholesterol Cal: 23 mg/dL (ref 5–40)

## 2023-04-24 LAB — HEMOGLOBIN A1C
Est. average glucose Bld gHb Est-mCnc: 105 mg/dL
Hgb A1c MFr Bld: 5.3 % (ref 4.8–5.6)

## 2023-04-24 LAB — TSH RFX ON ABNORMAL TO FREE T4: TSH: 0.907 u[IU]/mL (ref 0.450–4.500)

## 2023-04-29 ENCOUNTER — Other Ambulatory Visit (HOSPITAL_BASED_OUTPATIENT_CLINIC_OR_DEPARTMENT_OTHER): Payer: Self-pay | Admitting: Family Medicine

## 2023-04-30 ENCOUNTER — Other Ambulatory Visit (HOSPITAL_COMMUNITY): Payer: Self-pay

## 2023-04-30 ENCOUNTER — Encounter (HOSPITAL_BASED_OUTPATIENT_CLINIC_OR_DEPARTMENT_OTHER): Payer: Self-pay | Admitting: Family Medicine

## 2023-04-30 ENCOUNTER — Other Ambulatory Visit: Payer: Self-pay

## 2023-04-30 MED ORDER — METHYLPHENIDATE HCL ER (OSM) 36 MG PO TBCR
36.0000 mg | EXTENDED_RELEASE_TABLET | Freq: Every day | ORAL | 0 refills | Status: DC
  Filled 2023-04-30 – 2023-05-01 (×2): qty 30, 30d supply, fill #0

## 2023-05-01 ENCOUNTER — Other Ambulatory Visit (HOSPITAL_COMMUNITY): Payer: Self-pay

## 2023-05-01 ENCOUNTER — Other Ambulatory Visit (HOSPITAL_BASED_OUTPATIENT_CLINIC_OR_DEPARTMENT_OTHER): Payer: Self-pay

## 2023-05-13 ENCOUNTER — Ambulatory Visit (INDEPENDENT_AMBULATORY_CARE_PROVIDER_SITE_OTHER): Payer: 59 | Admitting: Psychology

## 2023-05-13 DIAGNOSIS — F4323 Adjustment disorder with mixed anxiety and depressed mood: Secondary | ICD-10-CM

## 2023-05-13 NOTE — Progress Notes (Signed)
[facility] Behavioral Health Counselor Initial Adult Exam  Name: Joshua Yang Date: 05/13/2023 MRN: [id number] DOB: 02-10-2001 PCP: de Peru, [name], MD  Time spent: 10:00am-10:55am    55 minutes  Guardian/Payee:  Donnamarie Poag requested: No   Reason for Visit /Presenting Problem:  Pt present for face-to-face initial assessment update via video.  Pt consents to telehealth video session and is aware of limitations and benefits of virtual sessions.  Location of pt: home Location of therapist: home office.  Pt continues to need support as he navigates his current life stage. Pt tends to worry about things that are not in his control.  Pt works for [employer] in [employer] and likes his job.  He likes having a consistent work schedule that is M-F 8:30-5:00.   Pt has started to take classes at [employer] to resume his goal to get a degree in computer programming.   Pt has had some difficulties with his relationship with his father.  Addressed the issues and interactions and helped pt process his feelings and relationship dynamics.    Pt is in a long distance relationship for a year with [name].   He feels that relationship has been going well.   Reviewed pt's treatment plan for annual update.  Updated pt's treatment plan and IA.   Pt participated in setting treatment goals.   Plan to meet monthly.  Pt agrees with treatment plan.  Mental Status Exam: Appearance:   Casual     Behavior:  Appropriate  Motor:  Normal  Speech/Language:   Normal Rate  Affect:  Appropriate  Mood:  normal  Thought process:  normal  Thought content:    WNL  Sensory/Perceptual disturbances:    WNL  Orientation:  oriented to person, place, time/date, and situation  Attention:  Good  Concentration:  Good  Memory:  WNL  Fund of knowledge:   Good  Insight:    Good  Judgment:   Good  Impulse Control:  Good   Reported Symptoms:  stress  Risk Assessment: Danger to Self:  No Self-injurious  Behavior: No Danger to Others: No Duty to Warn:no Physical Aggression / Violence:No  Access to Firearms a concern: No  Gang Involvement:No  Patient / guardian was educated about steps to take if suicide or homicide risk level increases between visits: n/a While future psychiatric events cannot be accurately predicted, the patient does not currently require acute inpatient psychiatric care and does not currently meet [facility] involuntary commitment criteria.  Substance Abuse History: Current substance abuse: No     Past Psychiatric History:   Previous psychological history is significant for anxiety and depression Outpatient Providers:pt has been in therapy for several years. History of Psych Hospitalization: No  Psychological Testing:  n/a    Abuse History:  Victim of: No.,  n/a    Report needed: No. Victim of Neglect:No. Perpetrator of  n/a   Witness / Exposure to Domestic Violence: No   Protective Services Involvement: No  Witness to [employer] Violence:  No   Family History:  Family History  Problem Relation Age of Onset   Diabetes Other    Hyperlipidemia Father    Diabetes Maternal Grandfather    Hyperlipidemia Maternal Grandfather    Hypertension Maternal Grandfather    Arthritis Maternal Grandfather    Alcohol abuse Paternal Grandmother    Arthritis Paternal Grandmother    Arthritis Mother    Scoliosis Mother    Asthma Mother    Depression  Mother    ADD / ADHD Mother    Arthritis Maternal Grandmother    Arthritis Paternal Grandfather    Diabetes Other    Stroke Sister    Kidney disease Other    Diabetes Other     Living situation: the patient lives with his family.  Pt's parents have been divorced since 2010 and pt shared time with each parent. Parents have joint custody.  There is a family history of depression in father.  Pt's mother has anxiety.   Sexual Orientation: Gay  Relationship Status: single  Name of spouse / other:n/a If a parent,  number of children / ages:none  Support Systems: friends parents  Financial Stress:  No   Income/Employment/Disability: Employment  Financial planner: No   Educational History: Education: some college  Religion/Sprituality/World View: Protestant  Any cultural differences that may affect / interfere with treatment:  not applicable   Recreation/Hobbies: computer games  Stressors: Marital or family conflict   Occupational concerns    Strengths: Family, Friends, Journalist, newspaper, and Able to Communicate Effectively  Barriers:  none   Legal History: Pending legal issue / charges: The patient has no significant history of legal issues. History of legal issue / charges:  n/a  Medical History/Surgical History: reviewed Past Medical History:  Diagnosis Date   Allergy    seasonal   Anxiety     no medications   Asthma    allergy induced asthma   Cellulitis    hx of   Depression    Family history of adverse reaction to anesthesia    mother had historu of running a high fever after surgery, stated it is not malignant hyperthermia   Hearing loss, conductive, bilateral    Impetigo    hx of   Multiple hereditary osteochondromas    Staph skin infection    Vision abnormalities    wears glasses    Past Surgical History:  Procedure Laterality Date   EAR TUBE REMOVAL     OSTEOCHONDROMA EXCISION     x2 one on heel and one on upper thigh   OSTEOCHONDROMA EXCISION  03/10/2012   Procedure: OSTEOCHONDROMA EXCISION;  Surgeon: Nadara Mustard, MD;  Location: [facility] OR;  Service: Orthopedics;  Laterality: Right;  Excision distal right tibia and fibula osteochondroma   OSTEOCHONDROMA EXCISION Right 03/30/2013   Procedure: EXCISION OSTEOCHONDROMA RIGHT SHOULDER AND RIGHT WRIST;  Surgeon: Nadara Mustard, MD;  Location: [facility] OR;  Service: Orthopedics;  Laterality: Right;   OSTEOCHONDROMA EXCISION Bilateral 07/11/2015   Procedure: OSTEOCHONDROMA EXCISION BILATERAL MEDIAL TIBIAL PLATEAU;  Surgeon:  Nadara Mustard, MD;  Location: [facility] OR;  Service: Orthopedics;  Laterality: Bilateral;   OSTEOCHONDROMA EXCISION Left 04/01/2017   Procedure: OSTEOCHONDROMA EXCISION x2 LEFT WRIST;  Surgeon: Nadara Mustard, MD;  Location: [facility] OR;  Service: Orthopedics;  Laterality: Left;   TYMPANOSTOMY TUBE PLACEMENT      Medications: Current Outpatient Medications  Medication Sig Dispense Refill   albuterol (VENTOLIN HFA) 108 (90 Base) MCG/ACT inhaler Inhale 2 puffs into the lungs every 6 (six) hours as needed for wheezing or shortness of breath. For shortness of breath 18 g 1   levocetirizine (XYZAL) 5 MG tablet Take 1 tablet (5 mg total) by mouth every evening. 90 tablet 1   meclizine (ANTIVERT) 12.5 MG tablet Take 1 tablet (12.5 mg total) by mouth 3 (three) times daily as needed for dizziness. 30 tablet 0   methylphenidate 36 MG PO CR tablet Take 1 tablet (36 mg  total) by mouth daily. 30 tablet 0   sertraline (ZOLOFT) 100 MG tablet Take 1 tablet (100 mg total) by mouth daily. 90 tablet 1   No current facility-administered medications for this visit.    Allergies  Allergen Reactions   Latex Other (See Comments)    Skin irritation   Amoxicillin-Pot Clavulanate Diarrhea and Nausea And Vomiting    Severe    Tape Rash    Plastic Tape    Diagnoses:  F43.23  Plan of Care: Recommend ongoing therapy.   Pt participated in setting treatment goals.   Plan to meet monthly.  Pt agrees with treatment plan.   Treatment Plan Client Abilities/Strengths  Pt is bright, engaging, and motivated for therapy.  Client Treatment Preferences  Individual therapy.  Client Statement of Needs  Improve copings skills and understand herself better. Improve self esteem.  Symptoms  Depressed or irritable mood. Excessive and/or unrealistic worry that is difficult to control occurring more days than not for at least 6 months about a number of events or activities. Hypervigilance (e.g., feeling constantly on edge, experiencing  concentration difficulties, having trouble falling or staying asleep, exhibiting a general state of irritability). Low self-esteem. Problems Addressed  Unipolar Depression, Anxiety Goals 1. Alleviate depressive symptoms and return to previous level of effective functioning. 2. Appropriately grieve the loss in order to normalize mood and to return to previously adaptive level of functioning. Objective Learn and implement behavioral strategies to overcome depression. Target Date: 2024-05-12 Frequency: Monthly  Progress: 55 Modality: individual  Related Interventions Assist the client in developing skills that increase the likelihood of deriving pleasure from behavioral activation (e.g., assertiveness skills, developing an exercise plan, less internal/more external focus, increased social involvement); reinforce success. Engage the client in "behavioral activation," increasing his/her activity level and contact with sources of reward, while identifying processes that inhibit activation. use behavioral techniques such as instruction, rehearsal, role-playing, role reversal, as needed, to facilitate activity in the client's daily life; reinforce success. 3. Develop healthy interpersonal relationships that lead to the alleviation and help prevent the relapse of depression. 4. Develop healthy thinking patterns and beliefs about self, others, and the world that lead to the alleviation and help prevent the relapse of depression. 5. Enhance ability to effectively cope with the full variety of life's worries and anxieties. 6. Learn and implement coping skills that result in a reduction of anxiety and worry, and improved daily functioning. Objective Learn and implement problem-solving strategies for realistically addressing worries. Target Date: 2024-05-12 Frequency: Monthly  Progress: 55 Modality: individual  Related Interventions Assign the client a homework exercise in which he/she problem-solves a  current problem (see Mastery of Your Anxiety and Worry: Workbook by Elenora Fender and Filbert Schilder or Generalized Anxiety Disorder by Elesa Hacker, and Filbert Schilder); review, reinforce success, and provide corrective feedback toward improvement. Teach the client problem-solving strategies involving specifically defining a problem, generating options for addressing it, evaluating the pros and cons of each option, selecting and implementing an optional action, and reevaluating and refining the action. Objective Learn and implement calming skills to reduce overall anxiety and manage anxiety symptoms. Target Date: 2024-05-12 Frequency: Monthly  Progress: 55 Modality: individual  Related Interventions Assign the client to read about progressive muscle relaxation and other calming strategies in relevant books or treatment manuals (e.g., Progressive Relaxation Training by Twana First; Mastery of Your Anxiety and Worry: Workbook by Earlie Counts). Assign the client homework each session in which he/she practices relaxation exercises daily, gradually applying them progressively  from non-anxiety-provoking to anxiety-provoking situations; review and reinforce success while providing corrective feedback toward improvement. Teach the client calming/relaxation skills (e.g., applied relaxation, progressive muscle relaxation, cue controlled relaxation; mindful breathing; biofeedback) and how to discriminate better between relaxation and tension; teach the client how to apply these skills to his/her daily life. 7. Recognize, accept, and cope with feelings of depression. 8. Reduce overall frequency, intensity, and duration of the anxiety so that daily functioning is not impaired. 9. Resolve the core conflict that is the source of anxiety. 10. Stabilize anxiety level while increasing ability to function on a daily basis. Diagnosis F43.23   Conditions For Discharge Achievement of treatment goals and objectives     Salomon Fick, LCSW

## 2023-05-19 ENCOUNTER — Encounter (HOSPITAL_BASED_OUTPATIENT_CLINIC_OR_DEPARTMENT_OTHER): Payer: Self-pay | Admitting: Family Medicine

## 2023-05-19 ENCOUNTER — Other Ambulatory Visit (HOSPITAL_BASED_OUTPATIENT_CLINIC_OR_DEPARTMENT_OTHER): Payer: Self-pay

## 2023-05-19 DIAGNOSIS — J4520 Mild intermittent asthma, uncomplicated: Secondary | ICD-10-CM

## 2023-05-19 MED ORDER — ALBUTEROL SULFATE HFA 108 (90 BASE) MCG/ACT IN AERS
2.0000 | INHALATION_SPRAY | Freq: Four times a day (QID) | RESPIRATORY_TRACT | 1 refills | Status: DC | PRN
  Filled 2023-05-19: qty 6.7, 25d supply, fill #0

## 2023-06-01 ENCOUNTER — Other Ambulatory Visit (HOSPITAL_BASED_OUTPATIENT_CLINIC_OR_DEPARTMENT_OTHER): Payer: Self-pay

## 2023-06-06 ENCOUNTER — Telehealth: Payer: 59 | Admitting: Family Medicine

## 2023-06-06 ENCOUNTER — Other Ambulatory Visit (HOSPITAL_BASED_OUTPATIENT_CLINIC_OR_DEPARTMENT_OTHER): Payer: Self-pay

## 2023-06-06 DIAGNOSIS — R051 Acute cough: Secondary | ICD-10-CM

## 2023-06-06 DIAGNOSIS — J029 Acute pharyngitis, unspecified: Secondary | ICD-10-CM

## 2023-06-06 MED ORDER — AMOXICILLIN 400 MG/5ML PO SUSR
800.0000 mg | Freq: Two times a day (BID) | ORAL | 0 refills | Status: AC
  Filled 2023-06-06: qty 225, 10d supply, fill #0

## 2023-06-06 NOTE — Progress Notes (Signed)
Virtual Visit Consent   Joshua Yang, you are scheduled for a virtual visit with a Joshua Yang today. Just as with appointments in the office, your consent must be obtained to participate. Your consent will be active for this visit and any virtual visit you may have with one of our providers in the next 365 days. If you have a MyChart account, a copy of this consent can be sent to you electronically.  As this is a virtual visit, video technology does not allow for your Yang to perform a traditional examination. This may limit your Yang's ability to fully assess your condition. If your Yang identifies any concerns that need to be evaluated in person or the need to arrange testing (such as labs, EKG, etc.), we will make arrangements to do so. Although advances in technology are sophisticated, we cannot ensure that it will always work on either your end or our end. If the connection with a video visit is poor, the visit may have to be switched to a telephone visit. With either a video or telephone visit, we are not always able to ensure that we have a secure connection.  By engaging in this virtual visit, you consent to the provision of healthcare and authorize for your insurance to be billed (if applicable) for the services provided during this visit. Depending on your insurance coverage, you may receive a charge related to this service.  I need to obtain your verbal consent now. Are you willing to proceed with your visit today? Joshua Yang has provided verbal consent on 06/06/2023 for a virtual visit (video or telephone). Georgana Curio, FNP  Date: 06/06/2023 1:08 PM  Virtual Visit via Video Note   I, Georgana Curio, connected with  Joshua Yang  ([id number], 03-Nov-2000) on 06/06/23 at  1:00 PM EDT by a video-enabled telemedicine application and verified that I am speaking with the correct person using two identifiers.  Location: Patient: Virtual Visit  Location Patient: Home Yang: Virtual Visit Location Yang: Home Office   I discussed the limitations of evaluation and management by telemedicine and the availability of in person appointments. The patient expressed understanding and agreed to proceed.    History of Present Illness: Joshua Yang is a 22 y.o. who identifies as a male who was assigned male at birth, and is being seen today for sore throat for 4 days, cough with prod green mucus, neg covid test, no fever. Unknown exposure to strep. 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   Osteochondroma 07/11/2015    Allergies:  Allergies  Allergen Reactions   Latex Other (See Comments)    Skin irritation   Amoxicillin-Pot Clavulanate Diarrhea and Nausea And Vomiting    Severe    Tape Rash    Plastic Tape   Medications:  Current Outpatient Medications:    albuterol (VENTOLIN HFA) 108 (90 Base) MCG/ACT inhaler, Inhale 2 puffs into the lungs every 6 (six) hours as needed for wheezing or shortness of breath., Disp: 6.7 g, Rfl: 1   levocetirizine (XYZAL) 5 MG tablet, Take 1 tablet (5 mg total) by mouth every  evening., Disp: 90 tablet, Rfl: 1   meclizine (ANTIVERT) 12.5 MG tablet, Take 1 tablet (12.5 mg total) by mouth 3 (three) times daily as needed for dizziness., Disp: 30 tablet, Rfl: 0   methylphenidate 36 MG PO CR tablet, Take 1 tablet (36 mg total) by mouth daily., Disp: 30 tablet, Rfl: 0   sertraline (ZOLOFT) 100 MG tablet, Take 1 tablet (100 mg total) by mouth daily., Disp: 90 tablet, Rfl: 1  Observations/Objective: Patient is well-developed,  well-nourished in no acute distress.  Resting comfortably  at home.  Head is normocephalic, atraumatic.  No labored breathing.  Speech is clear and coherent with logical content.  Patient is alert and oriented at baseline.    Assessment and Plan: 1. Pharyngitis, unspecified etiology  Increase fluids, warm salt water gargles, ibuprofen as directed, UC if sx persist. Start antibiotic if sx persist 2-3 days.   Follow Up Instructions: I discussed the assessment and treatment plan with the patient. The patient was provided an opportunity to ask questions and all were answered. The patient agreed with the plan and demonstrated an understanding of the instructions.  A copy of instructions were sent to the patient via MyChart unless otherwise noted below.     The patient was advised to call back or seek an in-person evaluation if the symptoms worsen or if the condition fails to improve as anticipated.    Georgana Curio, FNP

## 2023-06-06 NOTE — Patient Instructions (Signed)

## 2023-06-14 ENCOUNTER — Other Ambulatory Visit: Payer: 59 | Attending: Oncology

## 2023-06-14 ENCOUNTER — Other Ambulatory Visit: Payer: 59

## 2023-07-03 ENCOUNTER — Ambulatory Visit (HOSPITAL_BASED_OUTPATIENT_CLINIC_OR_DEPARTMENT_OTHER): Payer: 59 | Admitting: Family Medicine

## 2023-07-03 ENCOUNTER — Ambulatory Visit (INDEPENDENT_AMBULATORY_CARE_PROVIDER_SITE_OTHER): Payer: 59 | Admitting: Psychology

## 2023-07-03 DIAGNOSIS — F4323 Adjustment disorder with mixed anxiety and depressed mood: Secondary | ICD-10-CM

## 2023-07-03 NOTE — Progress Notes (Signed)
[facility] Counselor/Therapist Progress Note  Patient ID: Kobyn Medal, MRN: [id number],    Date: 07/03/2023  Time Spent: 10:00am-10:55am   55 minutes   Treatment Type: Individual Therapy  Reported Symptoms: stress  Mental Status Exam: Appearance:  Casual     Behavior: Appropriate  Motor: Normal  Speech/Language:  Normal Rate  Affect: Appropriate  Mood: normal  Thought process: normal  Thought content:   WNL  Sensory/Perceptual disturbances:   WNL  Orientation: oriented to person, place, time/date, and situation  Attention: Good  Concentration: Good  Memory: WNL  Fund of knowledge:  Good  Insight:   Good  Judgment:  Good  Impulse Control: Good   Risk Assessment: Danger to Self:  No Self-injurious Behavior: No Danger to Others: No Duty to Warn:no Physical Aggression / Violence:No  Access to Firearms a concern: No  Gang Involvement:No   Subjective: Pt present for face-to-face individual therapy via video.  Pt consents to telehealth video session and is aware of limitations and benefits of virtual sessions.   Location of pt: home Location of therapist: home office.   Pt talked about work.  He states work has had its ups and downs.  Pt has been trying to do a lot of things to help other team members and departments but he was given feedback that he needs to "stay in his own lane".   Pt has applied for a team lead position but is not sure if the interview went well.   He was given feedback that he can be rigid and needs to have patience.  Helped pt process the feedback and work dynamics. Pt talked about his relationship with his father.   Pt states his father has been "tyrranical" and controlling.   Addressed their interactions.  Helped pt process his feelings and family dynamics.  Pt talked about Xmas vacation.  His vacation time was approved so her will be able to be at the beach with his family.  Pt's boyfriend [name] will join him which pt id  excited about.  Pt is worried about family drama. Provided supportive therapy.    Interventions: Cognitive Behavioral Therapy and Insight-Oriented  Diagnosis:   F43.23  Plan of Care: Recommend ongoing therapy.   Pt participated in setting treatment goals.   Plan to meet monthly.  Pt agrees with treatment plan.   Treatment Plan Client Abilities/Strengths  Pt is bright, engaging, and motivated for therapy.  Client Treatment Preferences  Individual therapy.  Client Statement of Needs  Improve copings skills and understand herself better. Improve self esteem.  Symptoms  Depressed or irritable mood. Excessive and/or unrealistic worry that is difficult to control occurring more days than not for at least 6 months about a number of events or activities. Hypervigilance (e.g., feeling constantly on edge, experiencing concentration difficulties, having trouble falling or staying asleep, exhibiting a general state of irritability). Low self-esteem. Problems Addressed  Unipolar Depression, Anxiety Goals 1. Alleviate depressive symptoms and return to previous level of effective functioning. 2. Appropriately grieve the loss in order to normalize mood and to return to previously adaptive level of functioning. Objective Learn and implement behavioral strategies to overcome depression. Target Date: 2024-05-12 Frequency: Monthly  Progress: 55 Modality: individual  Related Interventions Assist the client in developing skills that increase the likelihood of deriving pleasure from behavioral activation (e.g., assertiveness skills, developing an exercise plan, less internal/more external focus, increased social involvement); reinforce success. Engage the client in "behavioral activation," increasing his/her activity level and  contact with sources of reward, while identifying processes that inhibit activation. use behavioral techniques such as instruction, rehearsal, role-playing, role reversal, as needed, to  facilitate activity in the client's daily life; reinforce success. 3. Develop healthy interpersonal relationships that lead to the alleviation and help prevent the relapse of depression. 4. Develop healthy thinking patterns and beliefs about self, others, and the world that lead to the alleviation and help prevent the relapse of depression. 5. Enhance ability to effectively cope with the full variety of life's worries and anxieties. 6. Learn and implement coping skills that result in a reduction of anxiety and worry, and improved daily functioning. Objective Learn and implement problem-solving strategies for realistically addressing worries. Target Date: 2024-05-12 Frequency: Monthly  Progress: 55 Modality: individual  Related Interventions Assign the client a homework exercise in which he/she problem-solves a current problem (see Mastery of Your Anxiety and Worry: Workbook by Elenora Fender and Filbert Schilder or Generalized Anxiety Disorder by Elesa Hacker, and Filbert Schilder); review, reinforce success, and provide corrective feedback toward improvement. Teach the client problem-solving strategies involving specifically defining a problem, generating options for addressing it, evaluating the pros and cons of each option, selecting and implementing an optional action, and reevaluating and refining the action. Objective Learn and implement calming skills to reduce overall anxiety and manage anxiety symptoms. Target Date: 2024-05-12 Frequency: Monthly  Progress: 55 Modality: individual  Related Interventions Assign the client to read about progressive muscle relaxation and other calming strategies in relevant books or treatment manuals (e.g., Progressive Relaxation Training by Twana First; Mastery of Your Anxiety and Worry: Workbook by Earlie Counts). Assign the client homework each session in which he/she practices relaxation exercises daily, gradually applying them progressively from  non-anxiety-provoking to anxiety-provoking situations; review and reinforce success while providing corrective feedback toward improvement. Teach the client calming/relaxation skills (e.g., applied relaxation, progressive muscle relaxation, cue controlled relaxation; mindful breathing; biofeedback) and how to discriminate better between relaxation and tension; teach the client how to apply these skills to his/her daily life. 7. Recognize, accept, and cope with feelings of depression. 8. Reduce overall frequency, intensity, and duration of the anxiety so that daily functioning is not impaired. 9. Resolve the core conflict that is the source of anxiety. 10. Stabilize anxiety level while increasing ability to function on a daily basis. Diagnosis F43.23   Conditions For Discharge Achievement of treatment goals and objectives   Salomon Fick, LCSW

## 2023-07-09 ENCOUNTER — Encounter (HOSPITAL_BASED_OUTPATIENT_CLINIC_OR_DEPARTMENT_OTHER): Payer: Self-pay | Admitting: Family Medicine

## 2023-09-11 ENCOUNTER — Encounter (HOSPITAL_BASED_OUTPATIENT_CLINIC_OR_DEPARTMENT_OTHER): Payer: Self-pay | Admitting: Family Medicine

## 2023-09-11 ENCOUNTER — Ambulatory Visit (HOSPITAL_BASED_OUTPATIENT_CLINIC_OR_DEPARTMENT_OTHER): Payer: Commercial Managed Care - PPO | Admitting: Family Medicine

## 2023-09-11 ENCOUNTER — Ambulatory Visit (INDEPENDENT_AMBULATORY_CARE_PROVIDER_SITE_OTHER): Payer: Commercial Managed Care - PPO

## 2023-09-11 ENCOUNTER — Other Ambulatory Visit (HOSPITAL_BASED_OUTPATIENT_CLINIC_OR_DEPARTMENT_OTHER): Payer: Self-pay

## 2023-09-11 VITALS — BP 118/89 | HR 83 | Ht 69.0 in | Wt 248.2 lb

## 2023-09-11 DIAGNOSIS — D169 Benign neoplasm of bone and articular cartilage, unspecified: Secondary | ICD-10-CM

## 2023-09-11 DIAGNOSIS — F902 Attention-deficit hyperactivity disorder, combined type: Secondary | ICD-10-CM

## 2023-09-11 DIAGNOSIS — M25531 Pain in right wrist: Secondary | ICD-10-CM | POA: Diagnosis not present

## 2023-09-11 DIAGNOSIS — J301 Allergic rhinitis due to pollen: Secondary | ICD-10-CM | POA: Diagnosis not present

## 2023-09-11 DIAGNOSIS — F32A Depression, unspecified: Secondary | ICD-10-CM | POA: Diagnosis not present

## 2023-09-11 DIAGNOSIS — M79631 Pain in right forearm: Secondary | ICD-10-CM | POA: Diagnosis not present

## 2023-09-11 MED ORDER — LEVOCETIRIZINE DIHYDROCHLORIDE 5 MG PO TABS
5.0000 mg | ORAL_TABLET | Freq: Every evening | ORAL | 1 refills | Status: DC
  Filled 2023-09-11: qty 90, 90d supply, fill #0

## 2023-09-11 MED ORDER — METHYLPHENIDATE HCL ER (OSM) 36 MG PO TBCR
36.0000 mg | EXTENDED_RELEASE_TABLET | Freq: Every day | ORAL | 0 refills | Status: DC
  Filled 2023-09-11: qty 30, 30d supply, fill #0

## 2023-09-11 MED ORDER — SERTRALINE HCL 100 MG PO TABS
100.0000 mg | ORAL_TABLET | Freq: Every day | ORAL | 1 refills | Status: DC
  Filled 2023-09-11: qty 90, 90d supply, fill #0

## 2023-09-11 NOTE — Assessment & Plan Note (Signed)
Noted on x-rays in 2022 with recommendation for MRI follow-up at that time.  Patient did not have MRI completed previously.  He was having pain at time of those prior x-rays and pain did subside.  Unfortunately, he has had return of pain within the area of forearm/wrist.  No new injury.  Has had some restriction range of motion, most notable with supination.  Given prior abnormal imaging, we can proceed with repeat x-rays at this time and determine additional follow-up or evaluation needed based on results.

## 2023-09-11 NOTE — Assessment & Plan Note (Signed)
Patient continues to do well with Concerta 36 mg dose, denies any side effects today.  Has been out of the medication for a little.  Denies any sleep-related concerns, no issues with chest pain, palpitations, appetite issues.  Today, PDMP reviewed, no red flags, will proceed with refill today

## 2023-09-11 NOTE — Patient Instructions (Signed)
  Medication Instructions:  Your physician recommends that you continue on your current medications as directed. Please refer to the Current Medication list given to you today. --If you need a refill on any your medications before your next appointment, please call your pharmacy first. If no refills are authorized on file call the office.-- Lab Work: Your physician has recommended that you have lab work today: none If you have labs (blood work) drawn today and your tests are completely normal, you will receive your results via MyChart message OR a phone call from our staff.  Please ensure you check your voicemail in the event that you authorized detailed messages to be left on a delegated number. If you have any lab test that is abnormal or we need to change your treatment, we will call you to review the results.  Referrals/Procedures/Imaging: xray  Follow-Up: Your next appointment:   Your physician recommends that you schedule a follow-up appointment in: 3 mth follow with Dr. de [place]  You will receive a text message or e-mail with a link to a survey about your care and experience with [place] today! We would greatly appreciate your feedback!   Thanks for letting us be apart of your health journey!!  Primary Care and Sports Medicine   Dr. Ceasar Mons Peru   We encourage you to activate your patient portal called "MyChart".  Sign up information is provided on this After Visit Summary.  MyChart is used to connect with patients for Virtual Visits (Telemedicine).  Patients are able to view lab/test results, encounter notes, upcoming appointments, etc.  Non-urgent messages can be sent to your provider as well. To learn more about what you can do with MyChart, please visit --  [email or web address].   [email or web address]

## 2023-09-11 NOTE — Progress Notes (Signed)
    Procedures performed today:    None.  Independent interpretation of notes and tests performed by another provider:   None.  Brief History, Exam, Impression, and Recommendations:    BP (!) 137/95 (BP Location: Right Arm, Patient Position: Sitting, Cuff Size: Normal)   Pulse 83   Ht 5\' 9"  (1.753 m)   Wt 248 lb 3.2 oz (112.6 kg)   SpO2 99%   BMI 36.65 kg/m   Osteochondroma Assessment & Plan: Noted on x-rays in 2022 with recommendation for MRI follow-up at that time.  Patient did not have MRI completed previously.  He was having pain at time of those prior x-rays and pain did subside.  Unfortunately, he has had return of pain within the area of forearm/wrist.  No new injury.  Has had some restriction range of motion, most notable with supination.  Given prior abnormal imaging, we can proceed with repeat x-rays at this time and determine additional follow-up or evaluation needed based on results.  Orders: -     DG Wrist Complete Right; Future  Attention deficit hyperactivity disorder (ADHD), combined type Assessment & Plan: Patient continues to do well with Concerta 36 mg dose, denies any side effects today.  Has been out of the medication for a little.  Denies any sleep-related concerns, no issues with chest pain, palpitations, appetite issues.  Today, PDMP reviewed, no red flags, will proceed with refill today   Depression, unspecified depression type Assessment & Plan: Patient has been doing well with sertraline at 100 mg daily.  Denies any side effects of medication.  Feels that dose is helping to control symptoms appropriately.  Would like to continue with 100 mg dose at this time which I feel is reasonable, refill sent to pharmacy on file  Orders: -     Sertraline HCl; Take 1 tablet (100 mg total) by mouth daily.  Dispense: 90 tablet; Refill: 1  Non-seasonal allergic rhinitis due to pollen -     Levocetirizine Dihydrochloride; Take 1 tablet (5 mg total) by mouth every  evening.  Dispense: 90 tablet; Refill: 1  Other orders -     Methylphenidate HCl ER (OSM); Take 1 tablet (36 mg total) by mouth daily.  Dispense: 30 tablet; Refill: 0  Return in about 3 months (around 12/10/2023).   ___________________________________________ Neelie Welshans de [place], MD, ABFM, CAQSM Primary Care and Sports Medicine [facility]

## 2023-09-11 NOTE — Assessment & Plan Note (Signed)
Patient has been doing well with sertraline at 100 mg daily.  Denies any side effects of medication.  Feels that dose is helping to control symptoms appropriately.  Would like to continue with 100 mg dose at this time which I feel is reasonable, refill sent to pharmacy on file

## 2023-10-02 ENCOUNTER — Ambulatory Visit: Payer: Commercial Managed Care - PPO | Admitting: Psychology

## 2023-10-15 ENCOUNTER — Ambulatory Visit: Payer: Commercial Managed Care - PPO | Admitting: Psychology

## 2023-10-15 DIAGNOSIS — F4323 Adjustment disorder with mixed anxiety and depressed mood: Secondary | ICD-10-CM

## 2023-10-15 NOTE — Progress Notes (Signed)
 [facility] Counselor/Therapist Progress Note  Patient ID: Joshua Yang, MRN: [id number],    Date: 10/15/2023  Time Spent: 9:00am-9:55am   55 minutes   Treatment Type: Individual Therapy  Reported Symptoms: stress  Mental Status Exam: Appearance:  Casual     Behavior: Appropriate  Motor: Normal  Speech/Language:  Normal Rate  Affect: Appropriate  Mood: normal  Thought process: normal  Thought content:   WNL  Sensory/Perceptual disturbances:   WNL  Orientation: oriented to person, place, time/date, and situation  Attention: Good  Concentration: Good  Memory: WNL  Fund of knowledge:  Good  Insight:   Good  Judgment:  Good  Impulse Control: Good   Risk Assessment: Danger to Self:  No Self-injurious Behavior: No Danger to Others: No Duty to Warn:no Physical Aggression / Violence:No  Access to Firearms a concern: No  Gang Involvement:No   Subjective: Pt present for face-to-face individual therapy via video.  Pt consents to telehealth video session and is aware of limitations and benefits of virtual sessions.   Location of pt: home Location of therapist: home office.   Pt talked about his relationship with [name].   He had week with him at the beach and it was great .  They are talking about [name] moving to [place] and possibly living together. Pt talked about work.  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  He is happy about the promotion but it has been a "rocky road" since.  His boss was fired so he was not properly trained.   Pt was encouraged to apply for the supervisor position but pt decided not to.   They are very short staffed.   Pt feels he is being belittled and undermined by management.  Addressed the work dynamics and how they impact pt.  Pt is not happy with his current job.  Pt plans to apply for a different position in [name] this summer.   Pt talked about his father.  Pt states his father is constantly fussing and yelling at [facility] which is  upsetting to pt.  [name] confides in pt about her marriage to pt's father.  Pt is worried they may get divorced.   Provided supportive therapy.    Interventions: Cognitive Behavioral Therapy and Insight-Oriented  Diagnosis:   F43.23  Plan of Care: Recommend ongoing therapy.   Pt participated in setting treatment goals.   Plan to meet monthly.  Pt agrees with treatment plan.   Treatment Plan Client Abilities/Strengths  Pt is bright, engaging, and motivated for therapy.  Client Treatment Preferences  Individual therapy.  Client Statement of Needs  Improve copings skills and understand herself better. Improve self esteem.  Symptoms  Depressed or irritable mood. Excessive and/or unrealistic worry that is difficult to control occurring more days than not for at least 6 months about a number of events or activities. Hypervigilance (e.g., feeling constantly on edge, experiencing concentration difficulties, having trouble falling or staying asleep, exhibiting a general state of irritability). Low self-esteem. Problems Addressed  Unipolar Depression, Anxiety Goals 1. Alleviate depressive symptoms and return to previous level of effective functioning. 2. Appropriately grieve the loss in order to normalize mood and to return to previously adaptive level of functioning. Objective Learn and implement behavioral strategies to overcome depression. Target Date: 2024-05-12 Frequency: Monthly  Progress: 55 Modality: individual  Related Interventions Assist the client in developing skills that increase the likelihood of deriving pleasure from behavioral activation (e.g., assertiveness skills, developing an exercise plan, less  internal/more external focus, increased social involvement); reinforce success. Engage the client in "behavioral activation," increasing his/her activity level and contact with sources of reward, while identifying processes that inhibit activation. use behavioral techniques such as  instruction, rehearsal, role-playing, role reversal, as needed, to facilitate activity in the client's daily life; reinforce success. 3. Develop healthy interpersonal relationships that lead to the alleviation and help prevent the relapse of depression. 4. Develop healthy thinking patterns and beliefs about self, others, and the world that lead to the alleviation and help prevent the relapse of depression. 5. Enhance ability to effectively cope with the full variety of life's worries and anxieties. 6. Learn and implement coping skills that result in a reduction of anxiety and worry, and improved daily functioning. Objective Learn and implement problem-solving strategies for realistically addressing worries. Target Date: 2024-05-12 Frequency: Monthly  Progress: 55 Modality: individual  Related Interventions Assign the client a homework exercise in which he/she problem-solves a current problem (see Mastery of Your Anxiety and Worry: Workbook by Elenora Fender and Filbert Schilder or Generalized Anxiety Disorder by Elesa Hacker, and Filbert Schilder); review, reinforce success, and provide corrective feedback toward improvement. Teach the client problem-solving strategies involving specifically defining a problem, generating options for addressing it, evaluating the pros and cons of each option, selecting and implementing an optional action, and reevaluating and refining the action. Objective Learn and implement calming skills to reduce overall anxiety and manage anxiety symptoms. Target Date: 2024-05-12 Frequency: Monthly  Progress: 55 Modality: individual  Related Interventions Assign the client to read about progressive muscle relaxation and other calming strategies in relevant books or treatment manuals (e.g., Progressive Relaxation Training by Twana First; Mastery of Your Anxiety and Worry: Workbook by Earlie Counts). Assign the client homework each session in which he/she practices relaxation exercises  daily, gradually applying them progressively from non-anxiety-provoking to anxiety-provoking situations; review and reinforce success while providing corrective feedback toward improvement. Teach the client calming/relaxation skills (e.g., applied relaxation, progressive muscle relaxation, cue controlled relaxation; mindful breathing; biofeedback) and how to discriminate better between relaxation and tension; teach the client how to apply these skills to his/her daily life. 7. Recognize, accept, and cope with feelings of depression. 8. Reduce overall frequency, intensity, and duration of the anxiety so that daily functioning is not impaired. 9. Resolve the core conflict that is the source of anxiety. 10. Stabilize anxiety level while increasing ability to function on a daily basis. Diagnosis F43.23   Conditions For Discharge Achievement of treatment goals and objectives   Salomon Fick, LCSW

## 2023-11-20 ENCOUNTER — Ambulatory Visit: Payer: Commercial Managed Care - PPO | Admitting: Psychology

## 2023-11-24 ENCOUNTER — Ambulatory Visit (INDEPENDENT_AMBULATORY_CARE_PROVIDER_SITE_OTHER): Admitting: Psychology

## 2023-11-24 DIAGNOSIS — F4323 Adjustment disorder with mixed anxiety and depressed mood: Secondary | ICD-10-CM | POA: Diagnosis not present

## 2023-11-24 NOTE — Progress Notes (Signed)
 [facility] Counselor/Therapist Progress Note  Patient ID: Joshua Yang, MRN: [id number],    Date: 11/24/2023  Time Spent: 9:00am-9:55am   55 minutes   Treatment Type: Individual Therapy  Reported Symptoms: stress  Mental Status Exam: Appearance:  Casual     Behavior: Appropriate  Motor: Normal  Speech/Language:  Normal Rate  Affect: Appropriate  Mood: normal  Thought process: normal  Thought content:   WNL  Sensory/Perceptual disturbances:   WNL  Orientation: oriented to person, place, time/date, and situation  Attention: Good  Concentration: Good  Memory: WNL  Fund of knowledge:  Good  Insight:   Good  Judgment:  Good  Impulse Control: Good   Risk Assessment: Danger to Self:  No Self-injurious Behavior: No Danger to Others: No Duty to Warn:no Physical Aggression / Violence:No  Access to Firearms a concern: No  Gang Involvement:No   Subjective: Pt present for face-to-face individual therapy via video.  Pt consents to telehealth video session and is aware of limitations and benefits of virtual sessions.   Location of pt: home Location of therapist: home office.   Pt talked about his relationship with [name].   They are talking about [name] moving to [place] and possibly living together.  The relationship is going well.  Pt talked about work.   Pt states it is a stressful work environment.  Addressed the work dynamics and how they impact pt.  Worked with pt on his expectations and how he can release his attachment to specific outcomes about system changes he can't control.   Pt plans to apply for a different position in [employer] this summer.   Pt talked about his father. Pt states the relationship has been better recently. Pt talked about his college classes.  He is getting A's and B's but will be glad when the semester is over.  Provided supportive therapy.    Interventions: Cognitive Behavioral Therapy and Insight-Oriented  Diagnosis:    F43.23  Plan of Care: Recommend ongoing therapy.   Pt participated in setting treatment goals.   Plan to meet monthly.  Pt agrees with treatment plan.   Treatment Plan Client Abilities/Strengths  Pt is bright, engaging, and motivated for therapy.  Client Treatment Preferences  Individual therapy.  Client Statement of Needs  Improve copings skills and understand herself better. Improve self esteem.  Symptoms  Depressed or irritable mood. Excessive and/or unrealistic worry that is difficult to control occurring more days than not for at least 6 months about a number of events or activities. Hypervigilance (e.g., feeling constantly on edge, experiencing concentration difficulties, having trouble falling or staying asleep, exhibiting a general state of irritability). Low self-esteem. Problems Addressed  Unipolar Depression, Anxiety Goals 1. Alleviate depressive symptoms and return to previous level of effective functioning. 2. Appropriately grieve the loss in order to normalize mood and to return to previously adaptive level of functioning. Objective Learn and implement behavioral strategies to overcome depression. Target Date: 2024-05-12 Frequency: Monthly  Progress: 55 Modality: individual  Related Interventions Assist the client in developing skills that increase the likelihood of deriving pleasure from behavioral activation (e.g., assertiveness skills, developing an exercise plan, less internal/more external focus, increased social involvement); reinforce success. Engage the client in "behavioral activation," increasing his/her activity level and contact with sources of reward, while identifying processes that inhibit activation. use behavioral techniques such as instruction, rehearsal, role-playing, role reversal, as needed, to facilitate activity in the client's daily life; reinforce success. 3. Develop healthy interpersonal relationships that  lead to the alleviation and help prevent the  relapse of depression. 4. Develop healthy thinking patterns and beliefs about self, others, and the world that lead to the alleviation and help prevent the relapse of depression. 5. Enhance ability to effectively cope with the full variety of life's worries and anxieties. 6. Learn and implement coping skills that result in a reduction of anxiety and worry, and improved daily functioning. Objective Learn and implement problem-solving strategies for realistically addressing worries. Target Date: 2024-05-12 Frequency: Monthly  Progress: 55 Modality: individual  Related Interventions Assign the client a homework exercise in which he/she problem-solves a current problem (see Mastery of Your Anxiety and Worry: Workbook by Elenora Fender and Filbert Schilder or Generalized Anxiety Disorder by Elesa Hacker, and Filbert Schilder); review, reinforce success, and provide corrective feedback toward improvement. Teach the client problem-solving strategies involving specifically defining a problem, generating options for addressing it, evaluating the pros and cons of each option, selecting and implementing an optional action, and reevaluating and refining the action. Objective Learn and implement calming skills to reduce overall anxiety and manage anxiety symptoms. Target Date: 2024-05-12 Frequency: Monthly  Progress: 55 Modality: individual  Related Interventions Assign the client to read about progressive muscle relaxation and other calming strategies in relevant books or treatment manuals (e.g., Progressive Relaxation Training by Twana First; Mastery of Your Anxiety and Worry: Workbook by Earlie Counts). Assign the client homework each session in which he/she practices relaxation exercises daily, gradually applying them progressively from non-anxiety-provoking to anxiety-provoking situations; review and reinforce success while providing corrective feedback toward improvement. Teach the client calming/relaxation skills  (e.g., applied relaxation, progressive muscle relaxation, cue controlled relaxation; mindful breathing; biofeedback) and how to discriminate better between relaxation and tension; teach the client how to apply these skills to his/her daily life. 7. Recognize, accept, and cope with feelings of depression. 8. Reduce overall frequency, intensity, and duration of the anxiety so that daily functioning is not impaired. 9. Resolve the core conflict that is the source of anxiety. 10. Stabilize anxiety level while increasing ability to function on a daily basis. Diagnosis F43.23   Conditions For Discharge Achievement of treatment goals and objectives   Salomon Fick, LCSW                  Fallan Mccarey [place], LCSW

## 2023-12-14 ENCOUNTER — Ambulatory Visit (HOSPITAL_BASED_OUTPATIENT_CLINIC_OR_DEPARTMENT_OTHER): Payer: Commercial Managed Care - PPO | Admitting: Family Medicine

## 2023-12-25 ENCOUNTER — Ambulatory Visit (INDEPENDENT_AMBULATORY_CARE_PROVIDER_SITE_OTHER): Payer: 59 | Admitting: Psychology

## 2023-12-25 DIAGNOSIS — F4323 Adjustment disorder with mixed anxiety and depressed mood: Secondary | ICD-10-CM

## 2023-12-25 NOTE — Progress Notes (Signed)
  [facility] Counselor/Therapist Progress Note  Patient ID: Joshua Yang, MRN: [id number],    Date: 12/25/2023  Time Spent: 11:00am-11:50am   50 minutes   Treatment Type: Individual Therapy  Reported Symptoms: stress, frustration  Mental Status Exam: Appearance:  Casual     Behavior: Appropriate  Motor: Normal  Speech/Language:  Normal Rate  Affect: Appropriate  Mood: normal  Thought process: normal  Thought content:   WNL  Sensory/Perceptual disturbances:   WNL  Orientation: oriented to person, place, time/date, and situation  Attention: Good  Concentration: Good  Memory: WNL  Fund of knowledge:  Good  Insight:   Good  Judgment:  Good  Impulse Control: Good   Risk Assessment: Danger to Self:  No Self-injurious Behavior: No Danger to Others: No Duty to Warn:no Physical Aggression / Violence:No  Access to Firearms a concern: No  Gang Involvement:No   Subjective: Pt present for face-to-face individual therapy via video.  Pt consents to telehealth video session and is aware of limitations and benefits of virtual sessions.   Location of pt: home Location of therapist: home office.   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  He has an interview May 27th.  He will find out if he gets into the program in June.  Pt is hoping that he will get in bc it entails a 20 week training program and then he would be given a job at one of the [employer].    Pt talked about work.   Pt states it is a stressful work environment.  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  Addressed the work dynamics and how they impact pt.  Worked with pt on his expectations and how he can "stay in his own lane" when needed.   Pt talked about his father and [name].  Pt thinks they may be headed toward divorce.  Pt has noticed issues in their relationship that he is concerned about.  Pt's father and [name] have tended to have a tumultuous relationship  throughout the years.   Addressed how this impacts pt.  Pt has final exams today and then will be done with school until he takes summer classes in June.   Pt is passing all of his classes.   Provided supportive therapy.    Interventions: Cognitive Behavioral Therapy and Insight-Oriented  Diagnosis:   F43.23  Plan of Care: Recommend ongoing therapy.   Pt participated in setting treatment goals.   Plan to meet monthly.  Pt agrees with treatment plan.   Treatment Plan Client Abilities/Strengths  Pt is bright, engaging, and motivated for therapy.  Client Treatment Preferences  Individual therapy.  Client Statement of Needs  Improve copings skills and understand herself better. Improve self esteem.  Symptoms  Depressed or irritable mood. Excessive and/or unrealistic worry that is difficult to control occurring more days than not for at least 6 months about a number of events or activities. Hypervigilance (e.g., feeling constantly on edge, experiencing concentration difficulties, having trouble falling or staying asleep, exhibiting a general state of irritability). Low self-esteem. Problems Addressed  Unipolar Depression, Anxiety Goals 1. Alleviate depressive symptoms and return to previous level of effective functioning. 2. Appropriately grieve the loss in order to normalize mood and to return to previously adaptive level of functioning. Objective Learn and implement behavioral strategies to overcome depression. Target Date: 2024-05-12 Frequency: Monthly  Progress: 55 Modality: individual  Related Interventions Assist the client in developing skills that increase the  likelihood of deriving pleasure from behavioral activation (e.g., assertiveness skills, developing an exercise plan, less internal/more external focus, increased social involvement); reinforce success. Engage the client in "behavioral activation," increasing his/her activity level and contact with sources of reward, while  identifying processes that inhibit activation. use behavioral techniques such as instruction, rehearsal, role-playing, role reversal, as needed, to facilitate activity in the client's daily life; reinforce success. 3. Develop healthy interpersonal relationships that lead to the alleviation and help prevent the relapse of depression. 4. Develop healthy thinking patterns and beliefs about self, others, and the world that lead to the alleviation and help prevent the relapse of depression. 5. Enhance ability to effectively cope with the full variety of life's worries and anxieties. 6. Learn and implement coping skills that result in a reduction of anxiety and worry, and improved daily functioning. Objective Learn and implement problem-solving strategies for realistically addressing worries. Target Date: 2024-05-12 Frequency: Monthly  Progress: 55 Modality: individual  Related Interventions Assign the client a homework exercise in which he/she problem-solves a current problem (see Mastery of Your Anxiety and Worry: Workbook by Colbert Dates and Edna Gouty or Generalized Anxiety Disorder by Woodson He, and Edna Gouty); review, reinforce success, and provide corrective feedback toward improvement. Teach the client problem-solving strategies involving specifically defining a problem, generating options for addressing it, evaluating the pros and cons of each option, selecting and implementing an optional action, and reevaluating and refining the action. Objective Learn and implement calming skills to reduce overall anxiety and manage anxiety symptoms. Target Date: 2024-05-12 Frequency: Monthly  Progress: 55 Modality: individual  Related Interventions Assign the client to read about progressive muscle relaxation and other calming strategies in relevant books or treatment manuals (e.g., Progressive Relaxation Training by Juleen [place]; Mastery of Your Anxiety and Worry: Workbook by Rodney Clamp). Assign  the client homework each session in which he/she practices relaxation exercises daily, gradually applying them progressively from non-anxiety-provoking to anxiety-provoking situations; review and reinforce success while providing corrective feedback toward improvement. Teach the client calming/relaxation skills (e.g., applied relaxation, progressive muscle relaxation, cue controlled relaxation; mindful breathing; biofeedback) and how to discriminate better between relaxation and tension; teach the client how to apply these skills to his/her daily life. 7. Recognize, accept, and cope with feelings of depression. 8. Reduce overall frequency, intensity, and duration of the anxiety so that daily functioning is not impaired. 9. Resolve the core conflict that is the source of anxiety. 10. Stabilize anxiety level while increasing ability to function on a daily basis. Diagnosis F43.23   Conditions For Discharge Achievement of treatment goals and objectives   Willey Harrier, LCSW

## 2024-02-05 ENCOUNTER — Ambulatory Visit: Admitting: Psychology

## 2024-02-05 DIAGNOSIS — F4323 Adjustment disorder with mixed anxiety and depressed mood: Secondary | ICD-10-CM

## 2024-02-05 NOTE — Progress Notes (Signed)
 [facility] Counselor/Therapist Progress Note  Patient ID: Joshua Yang, MRN: [id number],    Date: 02/05/2024  Time Spent: 12:00pm-12:45pm   45 minutes   Treatment Type: Individual Therapy  Reported Symptoms: stress, frustration  Mental Status Exam: Appearance:  Casual     Behavior: Appropriate  Motor: Normal  Speech/Language:  Normal Rate  Affect: Appropriate  Mood: normal  Thought process: normal  Thought content:   WNL  Sensory/Perceptual disturbances:   WNL  Orientation: oriented to person, place, time/date, and situation  Attention: Good  Concentration: Good  Memory: WNL  Fund of knowledge:  Good  Insight:   Good  Judgment:  Good  Impulse Control: Good   Risk Assessment: Danger to Self:  No Self-injurious Behavior: No Danger to Others: No Duty to Warn:no Physical Aggression / Violence:No  Access to Firearms a concern: No  Gang Involvement:No   Subjective: Pt present for face-to-face individual therapy via video.  Pt consents to telehealth video session and is aware of limitations and benefits of virtual sessions.   Location of pt: home Location of therapist: home office.   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  He just found out yesterday and is very excited.  The program starts the beginning of September.   Pt talked about work.   Pt feels it is a stressful work environment.  Addressed the work dynamics and how they impact pt.  Pt is trying to stay under the radar and stay in his own lane at his job between now and when he starts the pharmacy tech program. Pt talked about his father and Joshua Yang.   Pt states it seems like they are getting along better.  Pt is relieved by this.  Pt is taking summer classes and states they are difficult.   Pt talked about his boyfriend Joshua Yang who may move to [place] this summer.  Pt states their relationship is going well.   Provided supportive therapy.    Interventions:  Cognitive Behavioral Therapy and Insight-Oriented  Diagnosis:   F43.23  Plan of Care: Recommend ongoing therapy.   Pt participated in setting treatment goals.   Plan to meet monthly.  Pt agrees with treatment plan.   Treatment Plan Client Abilities/Strengths  Pt is bright, engaging, and motivated for therapy.  Client Treatment Preferences  Individual therapy.  Client Statement of Needs  Improve copings skills and understand herself better. Improve self esteem.  Symptoms  Depressed or irritable mood. Excessive and/or unrealistic worry that is difficult to control occurring more days than not for at least 6 months about a number of events or activities. Hypervigilance (e.g., feeling constantly on edge, experiencing concentration difficulties, having trouble falling or staying asleep, exhibiting a general state of irritability). Low self-esteem. Problems Addressed  Unipolar Depression, Anxiety Goals 1. Alleviate depressive symptoms and return to previous level of effective functioning. 2. Appropriately grieve the loss in order to normalize mood and to return to previously adaptive level of functioning. Objective Learn and implement behavioral strategies to overcome depression. Target Date: 2024-05-12 Frequency: Monthly  Progress: 55 Modality: individual  Related Interventions Assist the client in developing skills that increase the likelihood of deriving pleasure from behavioral activation (e.g., assertiveness skills, developing an exercise plan, less internal/more external focus, increased social involvement); reinforce success. Engage the client in behavioral activation, increasing his/her activity level and contact with sources of reward, while identifying processes that inhibit activation. use behavioral techniques such as instruction, rehearsal, role-playing, role reversal,  as needed, to facilitate activity in the client's daily life; reinforce success. 3. Develop healthy interpersonal  relationships that lead to the alleviation and help prevent the relapse of depression. 4. Develop healthy thinking patterns and beliefs about self, others, and the world that lead to the alleviation and help prevent the relapse of depression. 5. Enhance ability to effectively cope with the full variety of life's worries and anxieties. 6. Learn and implement coping skills that result in a reduction of anxiety and worry, and improved daily functioning. Objective Learn and implement problem-solving strategies for realistically addressing worries. Target Date: 2024-05-12 Frequency: Monthly  Progress: 55 Modality: individual  Related Interventions Assign the client a homework exercise in which he/she problem-solves a current problem (see Mastery of Your Anxiety and Worry: Workbook by Colbert Dates and Edna Gouty or Generalized Anxiety Disorder by Woodson He, and Edna Gouty); review, reinforce success, and provide corrective feedback toward improvement. Teach the client problem-solving strategies involving specifically defining a problem, generating options for addressing it, evaluating the pros and cons of each option, selecting and implementing an optional action, and reevaluating and refining the action. Objective Learn and implement calming skills to reduce overall anxiety and manage anxiety symptoms. Target Date: 2024-05-12 Frequency: Monthly  Progress: 55 Modality: individual  Related Interventions Assign the client to read about progressive muscle relaxation and other calming strategies in relevant books or treatment manuals (e.g., Progressive Relaxation Training by Juleen [place]; Mastery of Your Anxiety and Worry: Workbook by Rodney Clamp). Assign the client homework each session in which he/she practices relaxation exercises daily, gradually applying them progressively from non-anxiety-provoking to anxiety-provoking situations; review and reinforce success while providing corrective feedback  toward improvement. Teach the client calming/relaxation skills (e.g., applied relaxation, progressive muscle relaxation, cue controlled relaxation; mindful breathing; biofeedback) and how to discriminate better between relaxation and tension; teach the client how to apply these skills to his/her daily life. 7. Recognize, accept, and cope with feelings of depression. 8. Reduce overall frequency, intensity, and duration of the anxiety so that daily functioning is not impaired. 9. Resolve the core conflict that is the source of anxiety. 10. Stabilize anxiety level while increasing ability to function on a daily basis. Diagnosis F43.23   Conditions For Discharge Achievement of treatment goals and objectives   Willey Harrier, LCSW

## 2024-02-10 ENCOUNTER — Ambulatory Visit (HOSPITAL_BASED_OUTPATIENT_CLINIC_OR_DEPARTMENT_OTHER): Admitting: Family Medicine

## 2024-03-11 ENCOUNTER — Ambulatory Visit: Admitting: Psychology

## 2024-04-08 ENCOUNTER — Ambulatory Visit: Admitting: Psychology

## 2024-05-05 ENCOUNTER — Ambulatory Visit: Admitting: Psychology

## 2024-05-05 DIAGNOSIS — F4323 Adjustment disorder with mixed anxiety and depressed mood: Secondary | ICD-10-CM | POA: Diagnosis not present

## 2024-05-05 NOTE — Progress Notes (Signed)
 [facility] Counselor/Therapist Progress Note  Patient ID: Joshua Yang, MRN: [id number],    Date: 05/05/2024  Time Spent: 5:00pm-5:55pm   55 minutes   Treatment Type: Individual Therapy  Reported Symptoms: stress, frustration  Mental Status Exam: Appearance:  Casual     Behavior: Appropriate  Motor: Normal  Speech/Language:  Normal Rate  Affect: Appropriate  Mood: normal  Thought process: normal  Thought content:   WNL  Sensory/Perceptual disturbances:   WNL  Orientation: oriented to person, place, time/date, and situation  Attention: Good  Concentration: Good  Memory: WNL  Fund of knowledge:  Good  Insight:   Good  Judgment:  Good  Impulse Control: Good   Risk Assessment: Danger to Self:  No Self-injurious Behavior: No Danger to Others: No Duty to Warn:no Physical Aggression / Violence:No  Access to Firearms a concern: No  Gang Involvement:No   Subjective: Pt present for face-to-face individual therapy via video.  Pt consents to telehealth video session and is aware of limitations and benefits of virtual sessions.   Location of pt: home Location of therapist: home office.   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  The program started the beginning of September.  This is pt's 2nd week in the program and he has already taken 4 tests.  The program is very fast paced.   Pt really likes the program.   Pt is struggling a little bit with memorizing the top 200 drugs.  Pt feels some stress about the licensing exam but is trying to focus on having good study habits.   Pt quit his previous job so he can focus on the pharmacy tech program full time.   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   Pt talked about his boyfriend Joshua Yang. Joshua Yang may move to [place] this fall.  It keeps getting delayed which has been frustrating for pt. Addressed pt's frustrations.  They are trying  to get an apartment together which is a big step in their relationship.   Helped pt process his feelings and relationship dynamics.   Pt talked about his father and Joshua Yang.   They do not see each other much bc pt's father works in [place] a lot.   Provided supportive therapy.    Interventions: Cognitive Behavioral Therapy and Insight-Oriented  Diagnosis:   F43.23  Plan of Care: Recommend ongoing therapy.   Pt participated in setting treatment goals.   Plan to meet monthly.  Pt agrees with treatment plan.   Treatment Plan Client Abilities/Strengths  Pt is bright, engaging, and motivated for therapy.  Client Treatment Preferences  Individual therapy.  Client Statement of Needs  Improve copings skills and understand herself better. Improve self esteem.  Symptoms  Depressed or irritable mood. Excessive and/or unrealistic worry that is difficult to control occurring more days than not for at least 6 months about a number of events or activities. Hypervigilance (e.g., feeling constantly on edge, experiencing concentration difficulties, having trouble falling or staying asleep, exhibiting a general state of irritability). Low self-esteem. Problems Addressed  Unipolar Depression, Anxiety Goals 1. Alleviate depressive symptoms and return to previous level of effective functioning. 2. Appropriately grieve the loss in order to normalize mood and to return to previously adaptive level of functioning. Objective Learn and implement behavioral strategies to overcome depression. Target Date: 2024-05-12 Frequency: Monthly  Progress: 55 Modality: individual  Related Interventions Assist  the client in developing skills that increase the likelihood of deriving pleasure from behavioral activation (e.g., assertiveness skills, developing an exercise plan, less internal/more external focus, increased social involvement); reinforce success. Engage the client in behavioral activation, increasing his/her  activity level and contact with sources of reward, while identifying processes that inhibit activation. use behavioral techniques such as instruction, rehearsal, role-playing, role reversal, as needed, to facilitate activity in the client's daily life; reinforce success. 3. Develop healthy interpersonal relationships that lead to the alleviation and help prevent the relapse of depression. 4. Develop healthy thinking patterns and beliefs about self, others, and the world that lead to the alleviation and help prevent the relapse of depression. 5. Enhance ability to effectively cope with the full variety of life's worries and anxieties. 6. Learn and implement coping skills that result in a reduction of anxiety and worry, and improved daily functioning. Objective Learn and implement problem-solving strategies for realistically addressing worries. Target Date: 2024-05-12 Frequency: Monthly  Progress: 55 Modality: individual  Related Interventions Assign the client a homework exercise in which he/she problem-solves a current problem (see Mastery of Your Anxiety and Worry: Workbook by Richarda and Jonne or Generalized Anxiety Disorder by Delores Filler, and Jonne); review, reinforce success, and provide corrective feedback toward improvement. Teach the client problem-solving strategies involving specifically defining a problem, generating options for addressing it, evaluating the pros and cons of each option, selecting and implementing an optional action, and reevaluating and refining the action. Objective Learn and implement calming skills to reduce overall anxiety and manage anxiety symptoms. Target Date: 2024-05-12 Frequency: Monthly  Progress: 55 Modality: individual  Related Interventions Assign the client to read about progressive muscle relaxation and other calming strategies in relevant books or treatment manuals (e.g., Progressive Relaxation Training by Thornell armin Collier; Mastery of Your  Anxiety and Worry: Workbook by Richarda armin Jonne). Assign the client homework each session in which he/she practices relaxation exercises daily, gradually applying them progressively from non-anxiety-provoking to anxiety-provoking situations; review and reinforce success while providing corrective feedback toward improvement. Teach the client calming/relaxation skills (e.g., applied relaxation, progressive muscle relaxation, cue controlled relaxation; mindful breathing; biofeedback) and how to discriminate better between relaxation and tension; teach the client how to apply these skills to his/her daily life. 7. Recognize, accept, and cope with feelings of depression. 8. Reduce overall frequency, intensity, and duration of the anxiety so that daily functioning is not impaired. 9. Resolve the core conflict that is the source of anxiety. 10. Stabilize anxiety level while increasing ability to function on a daily basis. Diagnosis F43.23   Conditions For Discharge Achievement of treatment goals and objectives   Veva Alma, LCSW

## 2024-05-20 ENCOUNTER — Other Ambulatory Visit (HOSPITAL_COMMUNITY): Payer: Self-pay

## 2024-05-20 MED ORDER — FLUZONE 0.5 ML IM SUSY
0.5000 mL | PREFILLED_SYRINGE | Freq: Once | INTRAMUSCULAR | 0 refills | Status: AC
  Filled 2024-05-20: qty 0.5, 1d supply, fill #0

## 2024-05-25 ENCOUNTER — Encounter (HOSPITAL_BASED_OUTPATIENT_CLINIC_OR_DEPARTMENT_OTHER): Payer: Self-pay | Admitting: Family Medicine

## 2024-05-25 NOTE — Telephone Encounter (Signed)
 Please see mychart message sent by pt and advise.

## 2024-05-26 ENCOUNTER — Telehealth (HOSPITAL_BASED_OUTPATIENT_CLINIC_OR_DEPARTMENT_OTHER): Admitting: Family Medicine

## 2024-05-26 ENCOUNTER — Other Ambulatory Visit (HOSPITAL_COMMUNITY): Payer: Self-pay

## 2024-05-26 VITALS — Ht 69.0 in | Wt 230.0 lb

## 2024-05-26 DIAGNOSIS — J301 Allergic rhinitis due to pollen: Secondary | ICD-10-CM

## 2024-05-26 DIAGNOSIS — F902 Attention-deficit hyperactivity disorder, combined type: Secondary | ICD-10-CM

## 2024-05-26 DIAGNOSIS — Z0000 Encounter for general adult medical examination without abnormal findings: Secondary | ICD-10-CM

## 2024-05-26 DIAGNOSIS — D169 Benign neoplasm of bone and articular cartilage, unspecified: Secondary | ICD-10-CM

## 2024-05-26 MED ORDER — LEVOCETIRIZINE DIHYDROCHLORIDE 5 MG PO TABS
5.0000 mg | ORAL_TABLET | Freq: Every evening | ORAL | 1 refills | Status: AC
  Filled 2024-05-26: qty 90, 90d supply, fill #0
  Filled 2024-09-07: qty 90, 90d supply, fill #1
  Filled 2024-09-07 – 2024-09-08 (×2): qty 90, 90d supply, fill #0

## 2024-05-26 MED ORDER — METHYLPHENIDATE HCL ER (OSM) 36 MG PO TBCR
36.0000 mg | EXTENDED_RELEASE_TABLET | Freq: Every day | ORAL | 0 refills | Status: DC
  Filled 2024-05-26: qty 30, 30d supply, fill #0

## 2024-05-26 NOTE — Progress Notes (Signed)
   Virtual Visit   I connected with  Joshua Yang  on 05/26/24 by telephone/telehealth and verified that I am speaking with the correct person using two identifiers. Visit completed via video.   I discussed the limitations, risks, security and privacy concerns of performing an evaluation and management service by telephone, including the higher likelihood of inaccurate diagnosis and treatment, and the availability of in person appointments.  We also discussed the likely need of an additional face to face encounter for complete and high quality delivery of care.  I also discussed with the patient that there may be a patient responsible charge related to this service. The patient expressed understanding and wishes to proceed.  Provider location is in medical facility. Patient location is at their home, different from provider location. People involved in care of the patient during this telehealth encounter were myself, my nurse/medical assistant, and my front office/scheduling team member.  Review of Systems: No fevers, chills, night sweats, weight loss, chest pain, or shortness of breath.   Objective Findings:    General: Speaking full sentences, no audible heavy breathing.  Sounds alert and appropriately interactive.  Independent interpretation of tests performed by another provider:   None.  Brief History, Exam, Impression, and Recommendations:    Attention deficit hyperactivity disorder (ADHD), combined type Patient previously was doing well with Concerta  36 mg dose, denies having any side effects with medication previously.  Has been out of the medication for a little as he was not taking this consistently and did not have recent follow-up.  Denies any sleep-related concerns, no issues with chest pain, palpitations, appetite issues.  Today, PDMP reviewed, no red flags.  He would like to proceed with refill today to resume taking medication, feel this is reasonable, refill sent  to pharmacy on file  Osteochondroma He did complete x-rays for this earlier in the year.  X-rays showed mild interval increase in the length of osteochondroma, otherwise no new bony lesions or concerning features.  We discussed considerations, we can plan to recheck x-rays again in about 1 to 2 years for monitoring given slight interval change.  He is not currently have any pain or symptoms. Discussed consideration for orthopedic referral as well.  We can hold off on this for now.  Would likely proceed with referral if developing any symptoms such as pain or if further changes in the appearance are noted on follow-up x-rays  I discussed the above assessment and treatment plan with the patient. The patient was provided an opportunity to ask questions and all were answered. The patient agreed with the plan and demonstrated an understanding of the instructions.  The patient was advised to call back or seek an in-person evaluation if the symptoms worsen or if the condition fails to improve as anticipated.   I provided 12 minutes of face to face and non-face-to-face time during this encounter date, time was needed to gather information, review chart, records, communicate/coordinate with staff remotely, as well as complete documentation.   ___________________________________________ Joshua Timberman de [place], MD, ABFM, CAQSM Primary Care and Sports Medicine [facility]

## 2024-05-26 NOTE — Assessment & Plan Note (Signed)
 He did complete x-rays for this earlier in the year.  X-rays showed mild interval increase in the length of osteochondroma, otherwise no new bony lesions or concerning features.  We discussed considerations, we can plan to recheck x-rays again in about 1 to 2 years for monitoring given slight interval change.  He is not currently have any pain or symptoms. Discussed consideration for orthopedic referral as well.  We can hold off on this for now.  Would likely proceed with referral if developing any symptoms such as pain or if further changes in the appearance are noted on follow-up x-rays

## 2024-05-26 NOTE — Assessment & Plan Note (Signed)
 Patient previously was doing well with Concerta  36 mg dose, denies having any side effects with medication previously.  Has been out of the medication for a little as he was not taking this consistently and did not have recent follow-up.  Denies any sleep-related concerns, no issues with chest pain, palpitations, appetite issues.  Today, PDMP reviewed, no red flags.  He would like to proceed with refill today to resume taking medication, feel this is reasonable, refill sent to pharmacy on file

## 2024-05-27 ENCOUNTER — Ambulatory Visit (HOSPITAL_BASED_OUTPATIENT_CLINIC_OR_DEPARTMENT_OTHER): Admitting: Family Medicine

## 2024-05-27 ENCOUNTER — Other Ambulatory Visit (HOSPITAL_COMMUNITY): Payer: Self-pay

## 2024-06-01 ENCOUNTER — Other Ambulatory Visit: Payer: Self-pay | Admitting: Medical Genetics

## 2024-06-01 DIAGNOSIS — Z006 Encounter for examination for normal comparison and control in clinical research program: Secondary | ICD-10-CM

## 2024-06-22 ENCOUNTER — Ambulatory Visit (HOSPITAL_BASED_OUTPATIENT_CLINIC_OR_DEPARTMENT_OTHER): Admitting: Family Medicine

## 2024-06-27 ENCOUNTER — Other Ambulatory Visit (HOSPITAL_BASED_OUTPATIENT_CLINIC_OR_DEPARTMENT_OTHER): Payer: Self-pay | Admitting: Family Medicine

## 2024-06-27 MED ORDER — METHYLPHENIDATE HCL ER (OSM) 36 MG PO TBCR
36.0000 mg | EXTENDED_RELEASE_TABLET | Freq: Every day | ORAL | 0 refills | Status: AC
Start: 2024-06-27 — End: ?
  Filled 2024-06-27: qty 30, 30d supply, fill #0

## 2024-06-28 ENCOUNTER — Other Ambulatory Visit (HOSPITAL_COMMUNITY): Payer: Self-pay

## 2024-07-04 ENCOUNTER — Ambulatory Visit: Admitting: Psychology

## 2024-07-04 ENCOUNTER — Encounter: Payer: Self-pay | Admitting: Psychology

## 2024-07-04 DIAGNOSIS — F4323 Adjustment disorder with mixed anxiety and depressed mood: Secondary | ICD-10-CM | POA: Diagnosis not present

## 2024-07-04 NOTE — Progress Notes (Signed)
 [facility] Counselor Initial Adult Exam  Name: Joshua Yang Date: 07/04/2024 MRN: [id number] DOB: [birth year] PCP: [name], [name], MD  Time spent: 5:00pm - 5:55pm    55 minutes  Guardian/Payee:  debara Six requested: No   Reason for Visit /Presenting Problem:  Pt present for face-to-face initial assessment update via video.  Pt consents to telehealth video session and is aware of limitations and benefits of virtual sessions.  Location of pt: home Location of therapist: home office.  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  He states the program is going well overall.   It has been difficult at times but pt states he has powered through.    Pt talked about the recent death of his paternal grandmother.   Pt was very close to her when he was a child.  Helped pt process his feelings and grief.   Pt has had some difficulties with his relationship with his father.  Addressed the issues and interactions and helped pt process his feelings and relationship dynamics.    Pt is in a long distance relationship for 2 years with [name].   He feels that relationship has been going well.  [name] is moving to [facility] on November 28th and they will move into an apartment together.  Pt is excited about them being together more.  Reviewed pt's treatment plan for annual update.  Updated pt's treatment plan and IA.   Pt participated in setting treatment goals.   Plan to meet monthly.  Pt agrees with treatment plan.  Mental Status Exam: Appearance:   Casual     Behavior:  Appropriate  Motor:  Normal  Speech/Language:   Normal Rate  Affect:  Appropriate  Mood:  normal  Thought process:  normal  Thought content:    WNL  Sensory/Perceptual disturbances:    WNL  Orientation:  oriented to person, place, time/date, and situation  Attention:  Good  Concentration:  Good  Memory:  WNL  Fund of knowledge:   Good  Insight:    Good  Judgment:   Good  Impulse Control:  Good    Reported Symptoms:  stress  Risk Assessment: Danger to Self:  No Self-injurious Behavior: No Danger to Others: No Duty to Warn:no Physical Aggression / Violence:No  Access to Firearms a concern: No  Gang Involvement:No  Patient / guardian was educated about steps to take if suicide or homicide risk level increases between visits: n/a While future psychiatric events cannot be accurately predicted, the patient does not currently require acute inpatient psychiatric care and does not currently meet [facility]  involuntary commitment criteria.  Substance Abuse History: Current substance abuse: No     Past Psychiatric History:   Previous psychological history is significant for anxiety and depression Outpatient Providers:pt has been in therapy for several years. History of Psych Hospitalization: No  Psychological Testing: n/a   Abuse History:  Victim of: No., n/a   Report needed: No. Victim of Neglect:No. Perpetrator of n/a  Witness / Exposure to Domestic Violence: No   Protective Services Involvement: No  Witness to [employer] Violence:  No   Family History:  Family History  Problem Relation Age of Onset   Diabetes Other    Hyperlipidemia Father    Diabetes Maternal Grandfather    Hyperlipidemia Maternal Grandfather    Hypertension Maternal Grandfather    Arthritis Maternal Grandfather    Alcohol abuse Paternal Grandmother    Arthritis Paternal Grandmother  Arthritis Mother    Scoliosis Mother    Asthma Mother    Depression Mother    ADD / ADHD Mother    Arthritis Maternal Grandmother    Arthritis Paternal Grandfather    Diabetes Other    Stroke Sister    Kidney disease Other    Diabetes Other     Living situation: the patient lives with his family.  Pt's parents have been divorced since 2010 and pt shared time with each parent. Parents have joint custody.  There is a family history of depression in father.  Pt's mother has anxiety.   Sexual Orientation:  Gay  Relationship Status: single  Name of spouse / other:n/a If a parent, number of children / ages:none  Support Systems: friends parents  Financial Stress:  No   Income/Employment/Disability: Employment  Financial Planner: No   Educational History: Education: some college  Religion/Sprituality/World View: Protestant  Any cultural differences that may affect / interfere with treatment:  not applicable   Recreation/Hobbies: computer games  Stressors: Family conflict.   Vocational stress.  Strengths: Family, Friends, Journalist, Newspaper, and Able to Communicate Effectively  Barriers:  none   Legal History: Pending legal issue / charges: The patient has no significant history of legal issues. History of legal issue / charges: n/a  Medical History/Surgical History: reviewed Past Medical History:  Diagnosis Date   Allergy    seasonal   Anxiety     no medications   Asthma    allergy induced asthma   Cellulitis    hx of   Depression    Family history of adverse reaction to anesthesia    mother had historu of running a high fever after surgery, stated it is not malignant hyperthermia   Hearing loss, conductive, bilateral    Impetigo    hx of   Multiple hereditary osteochondromas    Staph skin infection    Vision abnormalities    wears glasses    Past Surgical History:  Procedure Laterality Date   EAR TUBE REMOVAL     OSTEOCHONDROMA EXCISION     x2 one on heel and one on upper thigh   OSTEOCHONDROMA EXCISION  03/10/2012   Procedure: OSTEOCHONDROMA EXCISION;  Surgeon: Jerona LULLA Sage, MD;  Location: [facility] OR;  Service: Orthopedics;  Laterality: Right;  Excision distal right tibia and fibula osteochondroma   OSTEOCHONDROMA EXCISION Right 03/30/2013   Procedure: EXCISION OSTEOCHONDROMA RIGHT SHOULDER AND RIGHT WRIST;  Surgeon: Jerona LULLA Sage, MD;  Location: [facility] OR;  Service: Orthopedics;  Laterality: Right;   OSTEOCHONDROMA EXCISION Bilateral 07/11/2015   Procedure:  OSTEOCHONDROMA EXCISION BILATERAL MEDIAL TIBIAL PLATEAU;  Surgeon: Jerona Sage LULLA, MD;  Location: [facility] OR;  Service: Orthopedics;  Laterality: Bilateral;   OSTEOCHONDROMA EXCISION Left 04/01/2017   Procedure: OSTEOCHONDROMA EXCISION x2 LEFT WRIST;  Surgeon: Sage Jerona LULLA, MD;  Location: [facility] OR;  Service: Orthopedics;  Laterality: Left;   TYMPANOSTOMY TUBE PLACEMENT      Medications: Current Outpatient Medications  Medication Sig Dispense Refill   albuterol  (VENTOLIN  HFA) 108 (90 Base) MCG/ACT inhaler Inhale 2 puffs into the lungs every 6 (six) hours as needed for wheezing or shortness of breath. 6.7 g 1   levocetirizine (XYZAL ) 5 MG tablet Take 1 tablet (5 mg total) by mouth every evening. 90 tablet 1   meclizine  (ANTIVERT ) 12.5 MG tablet Take 1 tablet (12.5 mg total) by mouth 3 (three) times daily as needed for dizziness. 30 tablet 0   methylphenidate  36 MG PO  CR tablet Take 1 tablet (36 mg total) by mouth daily. 30 tablet 0   sertraline  (ZOLOFT ) 100 MG tablet Take 1 tablet (100 mg total) by mouth daily. (Patient not taking: Reported on 05/26/2024) 90 tablet 1   No current facility-administered medications for this visit.    Allergies  Allergen Reactions   Latex Other (See Comments)    Skin irritation   Diethyl Toluamide (Deet) Other (See Comments)   Amoxicillin -Pot Clavulanate Diarrhea and Nausea And Vomiting    Severe    Tape Rash    Plastic Tape    Diagnoses:  F43.23  Plan of Care: Recommend ongoing therapy.   Pt participated in setting treatment goals.   Plan to meet monthly.  Pt agrees with treatment plan.   Treatment Plan Client Abilities/Strengths  Pt is bright, engaging, and motivated for therapy.  Client Treatment Preferences  Individual therapy.  Client Statement of Needs  Improve copings skills. Symptoms  Depressed or irritable mood. Excessive and/or unrealistic worry that is difficult to control occurring more days than not for at least 6 months about a number of  events or activities. Hypervigilance (e.g., feeling constantly on edge, experiencing concentration difficulties, having trouble falling or staying asleep, exhibiting a general state of irritability). Low self-esteem. Problems Addressed  Unipolar Depression, Anxiety Goals 1. Alleviate depressive symptoms and return to previous level of effective functioning. 2. Appropriately grieve the loss in order to normalize mood and to return to previously adaptive level of functioning. Objective Learn and implement behavioral strategies to overcome depression. Target Date: 2025-07-04 Frequency: Monthly  Progress: 55 Modality: individual  Related Interventions Assist the client in developing skills that increase the likelihood of deriving pleasure from behavioral activation (e.g., assertiveness skills, developing an exercise plan, less internal/more external focus, increased social involvement); reinforce success. Engage the client in behavioral activation, increasing his/her activity level and contact with sources of reward, while identifying processes that inhibit activation. use behavioral techniques such as instruction, rehearsal, role-playing, role reversal, as needed, to facilitate activity in the client's daily life; reinforce success. 3. Develop healthy interpersonal relationships that lead to the alleviation and help prevent the relapse of depression. 4. Develop healthy thinking patterns and beliefs about self, others, and the world that lead to the alleviation and help prevent the relapse of depression. 5. Enhance ability to effectively cope with the full variety of life's worries and anxieties. 6. Learn and implement coping skills that result in a reduction of anxiety and worry, and improved daily functioning. Objective Learn and implement problem-solving strategies for realistically addressing worries. Target Date: 2025-07-04 Frequency: Monthly  Progress: 55 Modality: individual  Related  Interventions Assign the client a homework exercise in which he/she problem-solves a current problem (see Mastery of Your Anxiety and Worry: Workbook by Richarda and Jonne or Generalized Anxiety Disorder by Delores Filler, and Jonne); review, reinforce success, and provide corrective feedback toward improvement. Teach the client problem-solving strategies involving specifically defining a problem, generating options for addressing it, evaluating the pros and cons of each option, selecting and implementing an optional action, and reevaluating and refining the action. Objective Learn and implement calming skills to reduce overall anxiety and manage anxiety symptoms. Target Date: 2025-07-04 Frequency: Monthly  Progress: 55 Modality: individual  Related Interventions Assign the client to read about progressive muscle relaxation and other calming strategies in relevant books or treatment manuals (e.g., Progressive Relaxation Training by Thornell armin Collier; Mastery of Your Anxiety and Worry: Workbook by Richarda armin Jonne). Assign the client homework  each session in which he/she practices relaxation exercises daily, gradually applying them progressively from non-anxiety-provoking to anxiety-provoking situations; review and reinforce success while providing corrective feedback toward improvement. Teach the client calming/relaxation skills (e.g., applied relaxation, progressive muscle relaxation, cue controlled relaxation; mindful breathing; biofeedback) and how to discriminate better between relaxation and tension; teach the client how to apply these skills to his/her daily life. 7. Recognize, accept, and cope with feelings of depression. 8. Reduce overall frequency, intensity, and duration of the anxiety so that daily functioning is not impaired. 9. Resolve the core conflict that is the source of anxiety. 10. Stabilize anxiety level while increasing ability to function on a daily basis. Diagnosis F43.23    Conditions For Discharge Achievement of treatment goals and objectives    Veva Alma, LCSW

## 2024-07-11 ENCOUNTER — Other Ambulatory Visit (HOSPITAL_BASED_OUTPATIENT_CLINIC_OR_DEPARTMENT_OTHER): Payer: Self-pay | Admitting: Family Medicine

## 2024-07-11 DIAGNOSIS — J4520 Mild intermittent asthma, uncomplicated: Secondary | ICD-10-CM

## 2024-07-12 ENCOUNTER — Encounter (HOSPITAL_BASED_OUTPATIENT_CLINIC_OR_DEPARTMENT_OTHER): Payer: Self-pay | Admitting: Family Medicine

## 2024-07-12 ENCOUNTER — Other Ambulatory Visit (HOSPITAL_COMMUNITY): Payer: Self-pay

## 2024-07-12 ENCOUNTER — Other Ambulatory Visit: Payer: Self-pay

## 2024-07-12 MED ORDER — ALBUTEROL SULFATE HFA 108 (90 BASE) MCG/ACT IN AERS
2.0000 | INHALATION_SPRAY | Freq: Four times a day (QID) | RESPIRATORY_TRACT | 1 refills | Status: AC | PRN
  Filled 2024-07-12: qty 6.7, 17d supply, fill #0
  Filled 2024-08-03: qty 6.7, 25d supply, fill #0

## 2024-07-12 NOTE — Telephone Encounter (Signed)
 Please see mychart message sent by pt and advise.

## 2024-07-13 ENCOUNTER — Other Ambulatory Visit: Payer: Self-pay

## 2024-07-13 ENCOUNTER — Encounter: Payer: Self-pay | Admitting: Pharmacist

## 2024-07-18 ENCOUNTER — Other Ambulatory Visit: Payer: Self-pay

## 2024-07-20 NOTE — Telephone Encounter (Signed)
 New mychart sent by pt checking status of forms that were printed off.

## 2024-08-02 ENCOUNTER — Ambulatory Visit: Admitting: Psychology

## 2024-08-02 DIAGNOSIS — F4323 Adjustment disorder with mixed anxiety and depressed mood: Secondary | ICD-10-CM | POA: Diagnosis not present

## 2024-08-02 NOTE — Progress Notes (Signed)
 [facility] Counselor/Therapist Progress Note  Patient ID: Joshua Yang, MRN: [id number],    Date: 08/02/2024  Time Spent: 5:00pm-5:55pm  55 minutes   Treatment Type: Individual Therapy  Reported Symptoms: stress  Mental Status Exam: Appearance:  Casual     Behavior: Appropriate  Motor: Normal  Speech/Language:  Normal Rate  Affect: Appropriate  Mood: normal  Thought process: normal  Thought content:   WNL  Sensory/Perceptual disturbances:   WNL  Orientation: oriented to person, place, time/date, and situation  Attention: Good  Concentration: Good  Memory: WNL  Fund of knowledge:  Good  Insight:   Good  Judgment:  Good  Impulse Control: Good   Risk Assessment: Danger to Self:  No Self-injurious Behavior: No Danger to Others: No Duty to Warn:no Physical Aggression / Violence:No  Access to Firearms a concern: No  Gang Involvement:No   Subjective: Pt present for face-to-face individual therapy via video.  Pt consents to telehealth video session and is aware of limitations and benefits of virtual sessions. Location of pt: home Location of therapist: home office.   Pt talked about moving into an apartment with his boyfriend [name].  There have been some adjustment issues.  They have tended to disagree on some financial decisions.  Addressed the issues and their interactions.  Pt is working on being assertive with [employer].  Helped pt process his feelings and relationship dynamics. 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  Pt is anxious about the exam.  Addressed pt's anxiety and worries.  Pt is preparing well for the exam and studying a lot. Pt found out he is being placed for a job at [employer] and he is not happy with that site.   Pt talked about being in his 3rd week of living with [facility].  Pt's father told pt they can have his washer and dryer.  Pt's father has cancelled on him twice  bc of helping out pt's sister.  Pt feels like his father is prioritizing his sister over him.  Addressed pt's feelings and helped him process his relationship with his father.   Provided supportive therapy.  Interventions: Cognitive Behavioral Therapy and Insight-Oriented  Diagnosis:  F43.23  Plan of Care: Recommend ongoing therapy.   Pt participated in setting treatment goals.   Plan to meet monthly.  Pt agrees with treatment plan.   Treatment Plan Client Abilities/Strengths  Pt is bright, engaging, and motivated for therapy.  Client Treatment Preferences  Individual therapy.  Client Statement of Needs  Improve copings skills. Symptoms  Depressed or irritable mood. Excessive and/or unrealistic worry that is difficult to control occurring more days than not for at least 6 months about a number of events or activities. Hypervigilance (e.g., feeling constantly on edge, experiencing concentration difficulties, having trouble falling or staying asleep, exhibiting a general state of irritability). Low self-esteem. Problems Addressed  Unipolar Depression, Anxiety Goals 1. Alleviate depressive symptoms and return to previous level of effective functioning. 2. Appropriately grieve the loss in order to normalize mood and to return to previously adaptive level of functioning. Objective Learn and implement behavioral strategies to overcome depression. Target Date: 2025-07-04 Frequency: Monthly  Progress: 55 Modality: individual  Related Interventions Assist the client in developing skills that increase the likelihood of deriving pleasure from behavioral activation (e.g., assertiveness skills, developing an exercise plan, less internal/more external focus, increased social involvement); reinforce success. Engage the client in behavioral activation, increasing his/her activity  level and contact with sources of reward, while identifying processes that inhibit activation. use behavioral techniques  such as instruction, rehearsal, role-playing, role reversal, as needed, to facilitate activity in the client's daily life; reinforce success. 3. Develop healthy interpersonal relationships that lead to the alleviation and help prevent the relapse of depression. 4. Develop healthy thinking patterns and beliefs about self, others, and the world that lead to the alleviation and help prevent the relapse of depression. 5. Enhance ability to effectively cope with the full variety of life's worries and anxieties. 6. Learn and implement coping skills that result in a reduction of anxiety and worry, and improved daily functioning. Objective Learn and implement problem-solving strategies for realistically addressing worries. Target Date: 2025-07-04 Frequency: Monthly  Progress: 55 Modality: individual  Related Interventions Assign the client a homework exercise in which he/she problem-solves a current problem (see Mastery of Your Anxiety and Worry: Workbook by Richarda and Jonne or Generalized Anxiety Disorder by Delores Filler, and Jonne); review, reinforce success, and provide corrective feedback toward improvement. Teach the client problem-solving strategies involving specifically defining a problem, generating options for addressing it, evaluating the pros and cons of each option, selecting and implementing an optional action, and reevaluating and refining the action. Objective Learn and implement calming skills to reduce overall anxiety and manage anxiety symptoms. Target Date: 2025-07-04 Frequency: Monthly  Progress: 55 Modality: individual  Related Interventions Assign the client to read about progressive muscle relaxation and other calming strategies in relevant books or treatment manuals (e.g., Progressive Relaxation Training by Thornell armin Collier; Mastery of Your Anxiety and Worry: Workbook by Richarda armin Jonne). Assign the client homework each session in which he/she practices relaxation  exercises daily, gradually applying them progressively from non-anxiety-provoking to anxiety-provoking situations; review and reinforce success while providing corrective feedback toward improvement. Teach the client calming/relaxation skills (e.g., applied relaxation, progressive muscle relaxation, cue controlled relaxation; mindful breathing; biofeedback) and how to discriminate better between relaxation and tension; teach the client how to apply these skills to his/her daily life. 7. Recognize, accept, and cope with feelings of depression. 8. Reduce overall frequency, intensity, and duration of the anxiety so that daily functioning is not impaired. 9. Resolve the core conflict that is the source of anxiety. 10. Stabilize anxiety level while increasing ability to function on a daily basis. Diagnosis F43.23   Conditions For Discharge Achievement of treatment goals and objectives   Veva Alma, LCSW

## 2024-08-03 ENCOUNTER — Other Ambulatory Visit (HOSPITAL_COMMUNITY): Payer: Self-pay

## 2024-08-04 ENCOUNTER — Other Ambulatory Visit (HOSPITAL_COMMUNITY): Payer: Self-pay

## 2024-08-04 ENCOUNTER — Other Ambulatory Visit (HOSPITAL_BASED_OUTPATIENT_CLINIC_OR_DEPARTMENT_OTHER): Payer: Self-pay | Admitting: Family Medicine

## 2024-08-04 MED ORDER — METHYLPHENIDATE HCL ER (OSM) 36 MG PO TBCR
36.0000 mg | EXTENDED_RELEASE_TABLET | Freq: Every day | ORAL | 0 refills | Status: DC
  Filled 2024-08-04: qty 30, 30d supply, fill #0

## 2024-08-22 DIAGNOSIS — Z0000 Encounter for general adult medical examination without abnormal findings: Secondary | ICD-10-CM

## 2024-08-23 ENCOUNTER — Ambulatory Visit (HOSPITAL_BASED_OUTPATIENT_CLINIC_OR_DEPARTMENT_OTHER): Payer: Self-pay | Admitting: Family Medicine

## 2024-08-23 LAB — CBC WITH DIFFERENTIAL/PLATELET
Basophils Absolute: 0 x10E3/uL (ref 0.0–0.2)
Basos: 0 %
EOS (ABSOLUTE): 0.4 x10E3/uL (ref 0.0–0.4)
Eos: 5 %
Hematocrit: 45.9 % (ref 37.5–51.0)
Hemoglobin: 15.4 g/dL (ref 13.0–17.7)
Immature Grans (Abs): 0 x10E3/uL (ref 0.0–0.1)
Immature Granulocytes: 0 %
Lymphocytes Absolute: 2.9 x10E3/uL (ref 0.7–3.1)
Lymphs: 35 %
MCH: 28.2 pg (ref 26.6–33.0)
MCHC: 33.6 g/dL (ref 31.5–35.7)
MCV: 84 fL (ref 79–97)
Monocytes Absolute: 0.7 x10E3/uL (ref 0.1–0.9)
Monocytes: 8 %
Neutrophils Absolute: 4.2 x10E3/uL (ref 1.4–7.0)
Neutrophils: 52 %
Platelets: 291 x10E3/uL (ref 150–450)
RBC: 5.47 x10E6/uL (ref 4.14–5.80)
RDW: 12.5 % (ref 11.6–15.4)
WBC: 8.2 x10E3/uL (ref 3.4–10.8)

## 2024-08-23 LAB — LIPID PANEL
Chol/HDL Ratio: 4.9 ratio (ref 0.0–5.0)
Cholesterol, Total: 188 mg/dL (ref 100–199)
HDL: 38 mg/dL — ABNORMAL LOW
LDL Chol Calc (NIH): 127 mg/dL — ABNORMAL HIGH (ref 0–99)
Triglycerides: 126 mg/dL (ref 0–149)
VLDL Cholesterol Cal: 23 mg/dL (ref 5–40)

## 2024-08-23 LAB — COMPREHENSIVE METABOLIC PANEL WITH GFR
ALT: 36 IU/L (ref 0–44)
AST: 27 IU/L (ref 0–40)
Albumin: 4.7 g/dL (ref 4.3–5.2)
Alkaline Phosphatase: 64 IU/L (ref 47–123)
BUN/Creatinine Ratio: 10 (ref 9–20)
BUN: 10 mg/dL (ref 6–20)
Bilirubin Total: 0.3 mg/dL (ref 0.0–1.2)
CO2: 24 mmol/L (ref 20–29)
Calcium: 10.2 mg/dL (ref 8.7–10.2)
Chloride: 105 mmol/L (ref 96–106)
Creatinine, Ser: 1 mg/dL (ref 0.76–1.27)
Globulin, Total: 2.8 g/dL (ref 1.5–4.5)
Glucose: 77 mg/dL (ref 70–99)
Potassium: 4.5 mmol/L (ref 3.5–5.2)
Sodium: 143 mmol/L (ref 134–144)
Total Protein: 7.5 g/dL (ref 6.0–8.5)
eGFR: 108 mL/min/1.73

## 2024-08-23 LAB — HEMOGLOBIN A1C
Est. average glucose Bld gHb Est-mCnc: 103 mg/dL
Hgb A1c MFr Bld: 5.2 % (ref 4.8–5.6)

## 2024-08-23 LAB — TSH RFX ON ABNORMAL TO FREE T4: TSH: 1.48 u[IU]/mL (ref 0.450–4.500)

## 2024-08-26 ENCOUNTER — Ambulatory Visit (INDEPENDENT_AMBULATORY_CARE_PROVIDER_SITE_OTHER): Admitting: Family Medicine

## 2024-08-26 ENCOUNTER — Encounter (HOSPITAL_BASED_OUTPATIENT_CLINIC_OR_DEPARTMENT_OTHER): Payer: Self-pay | Admitting: Family Medicine

## 2024-08-26 VITALS — BP 138/84 | HR 80 | Temp 98.2°F | Resp 18 | Ht 69.0 in | Wt 258.0 lb

## 2024-08-26 DIAGNOSIS — Z0000 Encounter for general adult medical examination without abnormal findings: Secondary | ICD-10-CM | POA: Diagnosis not present

## 2024-08-26 DIAGNOSIS — E785 Hyperlipidemia, unspecified: Secondary | ICD-10-CM | POA: Insufficient documentation

## 2024-08-26 NOTE — Assessment & Plan Note (Signed)

## 2024-08-26 NOTE — Progress Notes (Signed)
 " Subjective:    CC: Annual Physical Exam  HPI: Joshua Yang is a 24 y.o. presenting for annual physical  I reviewed the past medical history, family history, social history, surgical history, and allergies today and no changes were needed.  Please see the problem list section below in epic for further details.  Past Medical History: Past Medical History:  Diagnosis Date   Allergy    seasonal   Anxiety     no medications   Asthma    allergy induced asthma   Cellulitis    hx of   Depression    Dyslipidemia 08/26/2024   Family history of adverse reaction to anesthesia    mother had historu of running a high fever after surgery, stated it is not malignant hyperthermia   Hearing loss, conductive, bilateral    Impetigo    hx of   Multiple hereditary osteochondromas    Staph skin infection    Vision abnormalities    wears glasses   Past Surgical History: Past Surgical History:  Procedure Laterality Date   EAR TUBE REMOVAL     OSTEOCHONDROMA EXCISION     x2 one on heel and one on upper thigh   OSTEOCHONDROMA EXCISION  03/10/2012   Procedure: OSTEOCHONDROMA EXCISION;  Surgeon: Jerona LULLA Sage, MD;  Location: [facility] OR;  Service: Orthopedics;  Laterality: Right;  Excision distal right tibia and fibula osteochondroma   OSTEOCHONDROMA EXCISION Right 03/30/2013   Procedure: EXCISION OSTEOCHONDROMA RIGHT SHOULDER AND RIGHT WRIST;  Surgeon: Jerona LULLA Sage, MD;  Location: [facility] OR;  Service: Orthopedics;  Laterality: Right;   OSTEOCHONDROMA EXCISION Bilateral 07/11/2015   Procedure: OSTEOCHONDROMA EXCISION BILATERAL MEDIAL TIBIAL PLATEAU;  Surgeon: Jerona Sage LULLA, MD;  Location: [facility] OR;  Service: Orthopedics;  Laterality: Bilateral;   OSTEOCHONDROMA EXCISION Left 04/01/2017   Procedure: OSTEOCHONDROMA EXCISION x2 LEFT WRIST;  Surgeon: Sage Jerona LULLA, MD;  Location: [facility] OR;  Service: Orthopedics;  Laterality: Left;   TYMPANOSTOMY TUBE PLACEMENT     Social History: Social History    Socioeconomic History   Marital status: Single    Spouse name: Not on file   Number of children: Not on file   Years of education: Not on file   Highest education level: Some college, no degree  Occupational History   Not on file  Tobacco Use   Smoking status: Never    Passive exposure: Never   Smokeless tobacco: Never   Tobacco comments:    No smokers in home  Vaping Use   Vaping status: Never Used  Substance and Sexual Activity   Alcohol use: No   Drug use: No   Sexual activity: Not Currently    Birth control/protection: None  Other Topics Concern   Not on file  Social History Narrative   Not on file   Social Drivers of Health   Tobacco Use: Low Risk (08/26/2024)   Patient History    Smoking Tobacco Use: Never    Smokeless Tobacco Use: Never    Passive Exposure: Never  Financial Resource Strain: Low Risk (05/26/2024)   Overall Financial Resource Strain (CARDIA)    Difficulty of Paying Living Expenses: Not very hard  Food Insecurity: No Food Insecurity (05/26/2024)   Epic    Worried About Programme Researcher, Broadcasting/film/video in the Last Year: Never true    Ran Out of Food in the Last Year: Never true  Transportation Needs: No Transportation Needs (05/26/2024)   Epic    Lack of Transportation (Medical):  No    Lack of Transportation (Non-Medical): No  Physical Activity: Insufficiently Active (05/26/2024)   Exercise Vital Sign    Days of Exercise per Week: 3 days    Minutes of Exercise per Session: 30 min  Stress: No Stress Concern Present (05/26/2024)   [employer] of Occupational Health - Occupational Stress Questionnaire    Feeling of Stress: Only a little  Social Connections: Socially Isolated (05/26/2024)   Social Connection and Isolation Panel    Frequency of Communication with Friends and Family: Twice a week    Frequency of Social Gatherings with Friends and Family: Once a week    Attends Religious Services: Never    Database Administrator or Organizations: No     Attends Engineer, Structural: Not on file    Marital Status: Never married  Depression (PHQ2-9): Low Risk (08/26/2024)   Depression (PHQ2-9)    PHQ-2 Score: 4  Alcohol Screen: Low Risk (05/26/2024)   Alcohol Screen    Last Alcohol Screening Score (AUDIT): 1  Housing: Low Risk (05/26/2024)   Epic    Unable to Pay for Housing in the Last Year: No    Number of Times Moved in the Last Year: 0    Homeless in the Last Year: No  Utilities: Not on file  Health Literacy: Not on file   Family History: Family History  Problem Relation Age of Onset   Diabetes Other    Hyperlipidemia Father    Diabetes Maternal Grandfather    Hyperlipidemia Maternal Grandfather    Hypertension Maternal Grandfather    Arthritis Maternal Grandfather    Alcohol abuse Paternal Grandmother    Arthritis Paternal Grandmother    Arthritis Mother    Scoliosis Mother    Asthma Mother    Depression Mother    ADD / ADHD Mother    Arthritis Maternal Grandmother    Arthritis Paternal Grandfather    Diabetes Other    Stroke Sister    Kidney disease Other    Diabetes Other    Allergies: Allergies[1] Medications: See med rec.  Review of Systems: No headache, visual changes, nausea, vomiting, diarrhea, constipation, dizziness, abdominal pain, skin rash, fevers, chills, night sweats, swollen lymph nodes, weight loss, chest pain, body aches, joint swelling, muscle aches, shortness of breath, mood changes, visual or auditory hallucinations.  Objective:    BP 138/84 (BP Location: Right Arm, Patient Position: Sitting, Cuff Size: Large)   Pulse 80   Temp 98.2 F (36.8 C) (Oral)   Resp 18   Ht 5' 9 (1.753 m)   Wt 258 lb (117 kg)   SpO2 97%   BMI 38.10 kg/m   General: Well Developed, well nourished, and in no acute distress.  Neuro: Alert and oriented x3, extra-ocular muscles intact, sensation grossly intact. Cranial nerves II through XII are intact, motor, sensory, and coordinative functions are all  intact. HEENT: Normocephalic, atraumatic, pupils equal round reactive to light, neck supple, no masses, no lymphadenopathy, thyroid nonpalpable. Oropharynx, nasopharynx, external ear canals are unremarkable. Skin: Warm and dry, no rashes noted.  Cardiac: Regular rate and rhythm, no murmurs rubs or gallops.  Respiratory: Clear to auscultation bilaterally. Not using accessory muscles, speaking in full sentences.  Abdominal: Soft, nontender, nondistended, positive bowel sounds, no masses, no organomegaly.  Musculoskeletal: Shoulder, elbow, wrist, hip, knee, ankle stable, and with full range of motion.  Impression and Recommendations:    Wellness examination Assessment & Plan: Routine HCM labs reviewed. HCM reviewed/discussed. Anticipatory guidance  regarding healthy weight, lifestyle and choices given. Recommend healthy diet.  Recommend approximately 150 minutes/week of moderate intensity exercise Recommend regular dental and vision exams Always use seatbelt/lap and shoulder restraints Recommend using smoke alarms and checking batteries at least twice a year Recommend using sunscreen when outside Discussed immunization recommendations   Dyslipidemia Assessment & Plan: Noted on labs.  Discussed lifestyle modification recommendations.  We can monitor cholesterol panel intermittently moving forward.   Return in about 3 months (around 11/24/2024) for med check, can be virtual.   ___________________________________________ Braylee Bosher [name], MD, ABFM, CAQSM Primary Care and Sports Medicine [facility]    [1]  Allergies Allergen Reactions   Latex Other (See Comments)    Skin irritation   Diethyl Toluamide (Deet) Other (See Comments)   Amoxicillin -Pot Clavulanate Diarrhea and Nausea And Vomiting    Severe    Tape Rash    Plastic Tape   "

## 2024-08-26 NOTE — Assessment & Plan Note (Signed)
 Noted on labs.  Discussed lifestyle modification recommendations.  We can monitor cholesterol panel intermittently moving forward.

## 2024-09-06 ENCOUNTER — Telehealth (HOSPITAL_BASED_OUTPATIENT_CLINIC_OR_DEPARTMENT_OTHER): Payer: Self-pay | Admitting: Family Medicine

## 2024-09-06 ENCOUNTER — Other Ambulatory Visit: Payer: Self-pay

## 2024-09-06 ENCOUNTER — Other Ambulatory Visit (HOSPITAL_COMMUNITY): Payer: Self-pay

## 2024-09-06 ENCOUNTER — Other Ambulatory Visit (HOSPITAL_BASED_OUTPATIENT_CLINIC_OR_DEPARTMENT_OTHER): Payer: Self-pay | Admitting: Family Medicine

## 2024-09-06 NOTE — Telephone Encounter (Signed)
 Copied from CRM #[id number]. Topic: Clinical - Prescription Issue >> Sep 06, 2024  4:05 PM Fonda T wrote: Reason for CRM: Pt calling requesting medication,  methylphenidate  36 MG PO CR tablet, be sent to:  [facility] [address] [place] [address] Phone: [phone] Fax: [phone]  Current status of medication refill at time of call is pending.  Pt requesting above pharmacy due to convenience.  Requesting a follow up call if this can be done.  [phone]  Aware of follow up call.

## 2024-09-07 ENCOUNTER — Other Ambulatory Visit (HOSPITAL_COMMUNITY): Payer: Self-pay

## 2024-09-07 ENCOUNTER — Other Ambulatory Visit (HOSPITAL_BASED_OUTPATIENT_CLINIC_OR_DEPARTMENT_OTHER): Payer: Self-pay | Admitting: Family Medicine

## 2024-09-08 ENCOUNTER — Other Ambulatory Visit (HOSPITAL_COMMUNITY): Payer: Self-pay

## 2024-09-08 ENCOUNTER — Other Ambulatory Visit: Payer: Self-pay

## 2024-09-08 MED ORDER — METHYLPHENIDATE HCL ER (OSM) 36 MG PO TBCR
36.0000 mg | EXTENDED_RELEASE_TABLET | Freq: Every day | ORAL | 0 refills | Status: AC
  Filled 2024-09-08 (×2): qty 30, 30d supply, fill #0

## 2024-09-12 ENCOUNTER — Other Ambulatory Visit (HOSPITAL_COMMUNITY): Payer: Self-pay

## 2024-09-12 ENCOUNTER — Ambulatory Visit: Admitting: Psychology

## 2024-09-13 ENCOUNTER — Other Ambulatory Visit: Payer: Self-pay

## 2024-09-13 ENCOUNTER — Other Ambulatory Visit (HOSPITAL_COMMUNITY): Payer: Self-pay

## 2024-09-15 ENCOUNTER — Ambulatory Visit: Admitting: Psychology

## 2024-10-13 ENCOUNTER — Ambulatory Visit: Admitting: Psychology

## 2024-11-24 ENCOUNTER — Ambulatory Visit (HOSPITAL_BASED_OUTPATIENT_CLINIC_OR_DEPARTMENT_OTHER): Admitting: Family Medicine
